# Patient Record
Sex: Female | Born: 1955 | Race: White | Hispanic: No | Marital: Married | Smoking: Former smoker
Health system: Southern US, Community
[De-identification: ages and names within clinical notes are randomized; demographics above are authoritative.]

## PROBLEM LIST (undated history)

## (undated) DIAGNOSIS — K5790 Diverticulosis of intestine, part unspecified, without perforation or abscess without bleeding: Secondary | ICD-10-CM

## (undated) DIAGNOSIS — E039 Hypothyroidism, unspecified: Secondary | ICD-10-CM

## (undated) DIAGNOSIS — K219 Gastro-esophageal reflux disease without esophagitis: Secondary | ICD-10-CM

## (undated) DIAGNOSIS — J449 Chronic obstructive pulmonary disease, unspecified: Secondary | ICD-10-CM

## (undated) DIAGNOSIS — F419 Anxiety disorder, unspecified: Secondary | ICD-10-CM

## (undated) DIAGNOSIS — R569 Unspecified convulsions: Secondary | ICD-10-CM

## (undated) DIAGNOSIS — J40 Bronchitis, not specified as acute or chronic: Secondary | ICD-10-CM

## (undated) DIAGNOSIS — M549 Dorsalgia, unspecified: Secondary | ICD-10-CM

## (undated) DIAGNOSIS — E785 Hyperlipidemia, unspecified: Secondary | ICD-10-CM

## (undated) DIAGNOSIS — M1990 Unspecified osteoarthritis, unspecified site: Secondary | ICD-10-CM

## (undated) DIAGNOSIS — G8929 Other chronic pain: Secondary | ICD-10-CM

## (undated) DIAGNOSIS — I10 Essential (primary) hypertension: Secondary | ICD-10-CM

## (undated) HISTORY — PX: COLONOSCOPY: SHX174

## (undated) HISTORY — PX: HEMORRHOID SURGERY: SHX153

## (undated) HISTORY — PX: BACK SURGERY: SHX140

## (undated) HISTORY — PX: TUBAL LIGATION: SHX77

## (undated) HISTORY — PX: CHOLECYSTECTOMY: SHX55

---

## 2001-08-03 ENCOUNTER — Ambulatory Visit (HOSPITAL_COMMUNITY): Admission: RE | Admit: 2001-08-03 | Discharge: 2001-08-03 | Payer: Self-pay | Admitting: Obstetrics and Gynecology

## 2001-08-03 ENCOUNTER — Encounter: Payer: Self-pay | Admitting: Obstetrics and Gynecology

## 2002-11-16 ENCOUNTER — Ambulatory Visit (HOSPITAL_BASED_OUTPATIENT_CLINIC_OR_DEPARTMENT_OTHER): Admission: RE | Admit: 2002-11-16 | Discharge: 2002-11-16 | Payer: Self-pay | Admitting: Orthopedic Surgery

## 2009-03-17 ENCOUNTER — Encounter: Admission: RE | Admit: 2009-03-17 | Discharge: 2009-03-17 | Payer: Self-pay | Admitting: Family Medicine

## 2010-04-04 ENCOUNTER — Encounter: Admission: RE | Admit: 2010-04-04 | Discharge: 2010-04-04 | Payer: Self-pay | Admitting: Family Medicine

## 2011-03-15 NOTE — Op Note (Signed)
   NAMEKENNY, STERN                            ACCOUNT NO.:  [id number]   MEDICAL RECORD NO.:  [id number]                   PATIENT TYPE:  AMB   LOCATION:  DSC                                  FACILITY:  [facility]   PHYSICIAN:  Katy Fitch. Naaman Plummer., M.D.          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   POSTOPERATIVE DIAGNOSIS:  Stenosing tenosynovitis, right thumb at A-1  pulley.   OPERATION PERFORMED:  Release of right thumb, A-1 pulley.   SURGEON:  Katy Fitch. Sypher, M.D.   ASSISTANT:  Jonni Sanger, P.A.   ANESTHESIA:  0.25% Marcaine and 2% lidocaine metacarpal head level block,  right thumb.   SUPERVISING ANESTHESIOLOGIST:  Bedelia Person, M.D.   INDICATIONS FOR PROCEDURE:  The patient is a 55 year old right-handed woman  with a history of type 2 diabetes and hypothyroidism.  She has chronic  triggering of her right thumb unresponsive to nonoperative measures.  Due to  failure to respond to nonsurgical means, she is brought to the operating  room at this time for release of her right thumb A-1 pulley.   DESCRIPTION OF PROCEDURE:  The patient was brought to the operating room and  placed in supine position on the operating table.  Following induction of  general anesthesia by LMA, the right arm was prepped with Betadine soap  solution and sterilely draped.  When anesthesia was satisfactory, the arm  was exsanguinated with an Esmarch bandage and an arterial tourniquet on the  proximal brachium inflated to 220 mmHg.  The procedure commenced with a  short incision directly over the palpably enlarged A-1 pulley.  The  subcutaneous tissues were carefully divided releasing the subcutaneous  fascia.  The radial proper digital nerve was identified and gently  retracted.  The isolated A-1 pulley was split with a  scalpel and scissors.  The tendon was delivered and found to have a fusiform swelling.  The wound  was then repaired with intradermal 3-0 Prolene.  A compressive dressing was  applied with an Ace wrap.   For aftercare, the patient was given a prescription for Vicodin 5 mg one or  two tablets p.o. q.4-6h. p.r.n. pain, 20 tablets without refill.                                                Katy Fitch Naaman Plummer., M.D.    RVS/MEDQ  D:  11/16/2002  T:  11/16/2002  Job:  [id number]

## 2012-02-18 LAB — PULMONARY FUNCTION TEST

## 2012-03-06 ENCOUNTER — Other Ambulatory Visit: Payer: Self-pay | Admitting: Neurosurgery

## 2012-03-06 DIAGNOSIS — M549 Dorsalgia, unspecified: Secondary | ICD-10-CM

## 2012-03-11 ENCOUNTER — Ambulatory Visit
Admission: RE | Admit: 2012-03-11 | Discharge: 2012-03-11 | Disposition: A | Discharge status: Home / Self Care | Payer: BC Managed Care – PPO | Source: Ambulatory Visit | Attending: Neurosurgery | Admitting: Neurosurgery

## 2012-03-11 VITALS — BP 131/61 | HR 80

## 2012-03-11 DIAGNOSIS — M549 Dorsalgia, unspecified: Secondary | ICD-10-CM

## 2012-03-11 MED ORDER — IOHEXOL 300 MG/ML  SOLN
10.0000 mL | Freq: Once | INTRAMUSCULAR | Status: AC | PRN
  Administered 2012-03-11: 10 mL via INTRATHECAL

## 2012-03-11 MED ORDER — DIAZEPAM 5 MG PO TABS
10.0000 mg | ORAL_TABLET | Freq: Once | ORAL | Status: AC
  Administered 2012-03-11: 10 mg via ORAL

## 2012-03-11 MED ORDER — MEPERIDINE HCL 100 MG/ML IJ SOLN
75.0000 mg | Freq: Once | INTRAMUSCULAR | Status: AC
  Administered 2012-03-11: 75 mg via INTRAMUSCULAR

## 2012-03-11 MED ORDER — ONDANSETRON HCL 4 MG/2ML IJ SOLN
4.0000 mg | Freq: Once | INTRAMUSCULAR | Status: AC
  Administered 2012-03-11: 4 mg via INTRAMUSCULAR

## 2012-03-11 NOTE — Discharge Instructions (Signed)
Myelogram Discharge Instructions  1. Go home and rest quietly for the next 24 hours.  It is important to lie flat for the next 24 hours.  Get up only to go to the restroom.  You may lie in the bed or on a couch on your back, your stomach, your left side or your right side.  You may have one pillow under your head.  You may have pillows between your knees while you are on your side or under your knees while you are on your back.  2. DO NOT drive today.  Recline the seat as far back as it will go, while still wearing your seat belt, on the way home.  3. You may get up to go to the bathroom as needed.  You may sit up for 10 minutes to eat.  You may resume your normal diet and medications unless otherwise indicated.  Drink lots of extra fluids today and tomorrow.  4. The incidence of headache, nausea, or vomiting is about 5% (one in 20 patients).  If you develop a headache, lie flat and drink plenty of fluids until the headache goes away.  Caffeinated beverages may be helpful.  If you develop severe nausea and vomiting or a headache that does not go away with flat bed rest, call [phone].  5. You may resume normal activities after your 24 hours of bed rest is over; however, do not exert yourself strongly or do any heavy lifting tomorrow. If when you get up you have a headache when standing, go back to bed and force fluids for another 24 hours.  6. Call your physician for a follow-up appointment.  The results of your myelogram will be sent directly to your physician by the following day.  7. If you have any questions or if complications develop after you arrive home, please call [phone].  Discharge instructions have been explained to the patient.  The patient, or the person responsible for the patient, fully understands these instructions.       May resume escitalopram on Mar 12, 2012, after 9:30 am.

## 2012-03-19 ENCOUNTER — Encounter (HOSPITAL_COMMUNITY): Payer: Self-pay | Admitting: Pharmacy Technician

## 2012-03-19 ENCOUNTER — Other Ambulatory Visit: Payer: Self-pay | Admitting: Neurosurgery

## 2012-03-24 ENCOUNTER — Encounter (HOSPITAL_COMMUNITY): Payer: Self-pay | Admitting: *Deleted

## 2012-03-24 NOTE — Progress Notes (Signed)
Confirmed lab appointment with pt for 03/25/12 @ 0930

## 2012-03-24 NOTE — Progress Notes (Signed)
Pt doesn't have a cardiologist  Denies ever having an echo/stress test/heart cath  Medical MD is [employer] in [place]

## 2012-03-24 NOTE — Pre-Procedure Instructions (Signed)
20 JOSCELIN FRAY  03/24/2012   Your procedure is scheduled on:  Tues, June 4 @ 11:00 AM  Report to [facility] at 8:00 AM.  Call this number if you have problems the morning of surgery: [phone]   Remember:   Do not eat food:After Midnight.  May have clear liquids: up to 4 Hours before arrival.(until 4:00 am)  Clear liquids include soda, tea, black coffee, apple or grape juice, broth.,water  Take these medicines the morning of surgery with A SIP OF WATER: Albuterol<Bring Your Inhaler With You>,Xanax,Nexium,Advair,Synthroid(Levothyroxine),and Spiriva   Do not wear jewelry, make-up or nail polish.  Do not wear lotions, powders, or perfumes.   Do not shave 48 hours prior to surgery.   Do not bring valuables to the hospital.  Contacts, dentures or bridgework may not be worn into surgery.  Leave suitcase in the car. After surgery it may be brought to your room.  For patients admitted to the hospital, checkout time is 11:00 AM the day of discharge.   Special Instructions: CHG Shower Use Special Wash: 1/2 bottle night before surgery and 1/2 bottle morning of surgery.   Please read over the following fact sheets that you were given: Pain Booklet, Coughing and Deep Breathing, Blood Transfusion Information, MRSA Information and Surgical Site Infection Prevention

## 2012-03-25 ENCOUNTER — Encounter (HOSPITAL_COMMUNITY)
Admission: RE | Admit: 2012-03-25 | Discharge: 2012-03-25 | Disposition: A | Discharge status: Home / Self Care | Payer: BC Managed Care – PPO | Source: Ambulatory Visit | Attending: Neurosurgery | Admitting: Neurosurgery

## 2012-03-25 LAB — BASIC METABOLIC PANEL
BUN: 15 mg/dL (ref 6–23)
CO2: 27 mEq/L (ref 19–32)
Chloride: 102 mEq/L (ref 96–112)
Creatinine, Ser: 0.81 mg/dL (ref 0.50–1.10)
GFR calc Af Amer: >90 mL/min (ref >90)
Potassium: 5 mEq/L (ref 3.5–5.1)

## 2012-03-25 LAB — CBC
Hemoglobin: 13.4 g/dL (ref 12.0–15.0)
RBC: 4.83 MIL/uL (ref 3.87–5.11)
WBC: 7.3 10*3/uL (ref 4.0–10.5)

## 2012-03-25 LAB — SURGICAL PCR SCREEN
MRSA, PCR: NEGATIVE
Staphylococcus aureus: NEGATIVE

## 2012-03-25 LAB — TYPE AND SCREEN
ABO/RH(D): A POS
Antibody Screen: NEGATIVE

## 2012-03-31 ENCOUNTER — Inpatient Hospital Stay (HOSPITAL_COMMUNITY)
Admission: RE | Admit: 2012-03-31 | Discharge: 2012-04-04 | DRG: 755 | Disposition: A | Discharge status: Home / Self Care | Payer: BC Managed Care – PPO | Source: Ambulatory Visit | Attending: Neurosurgery | Admitting: Neurosurgery

## 2012-03-31 ENCOUNTER — Ambulatory Visit (HOSPITAL_COMMUNITY): Payer: BC Managed Care – PPO

## 2012-03-31 ENCOUNTER — Encounter (HOSPITAL_COMMUNITY): Payer: Self-pay | Admitting: Certified Registered"

## 2012-03-31 ENCOUNTER — Encounter (HOSPITAL_COMMUNITY)
Admission: RE | Disposition: A | Discharge status: Home / Self Care | Payer: Self-pay | Source: Ambulatory Visit | Attending: Neurosurgery

## 2012-03-31 ENCOUNTER — Ambulatory Visit (HOSPITAL_COMMUNITY): Payer: BC Managed Care – PPO | Admitting: Certified Registered"

## 2012-03-31 DIAGNOSIS — Z9089 Acquired absence of other organs: Secondary | ICD-10-CM

## 2012-03-31 DIAGNOSIS — E119 Type 2 diabetes mellitus without complications: Secondary | ICD-10-CM | POA: Diagnosis present

## 2012-03-31 DIAGNOSIS — M4716 Other spondylosis with myelopathy, lumbar region: Principal | ICD-10-CM | POA: Diagnosis present

## 2012-03-31 DIAGNOSIS — F411 Generalized anxiety disorder: Secondary | ICD-10-CM | POA: Diagnosis present

## 2012-03-31 DIAGNOSIS — I10 Essential (primary) hypertension: Secondary | ICD-10-CM | POA: Diagnosis present

## 2012-03-31 DIAGNOSIS — M47816 Spondylosis without myelopathy or radiculopathy, lumbar region: Secondary | ICD-10-CM

## 2012-03-31 DIAGNOSIS — G8929 Other chronic pain: Secondary | ICD-10-CM | POA: Diagnosis present

## 2012-03-31 DIAGNOSIS — E785 Hyperlipidemia, unspecified: Secondary | ICD-10-CM | POA: Diagnosis present

## 2012-03-31 DIAGNOSIS — K219 Gastro-esophageal reflux disease without esophagitis: Secondary | ICD-10-CM | POA: Diagnosis present

## 2012-03-31 DIAGNOSIS — J449 Chronic obstructive pulmonary disease, unspecified: Secondary | ICD-10-CM | POA: Diagnosis present

## 2012-03-31 DIAGNOSIS — Z8719 Personal history of other diseases of the digestive system: Secondary | ICD-10-CM

## 2012-03-31 DIAGNOSIS — E039 Hypothyroidism, unspecified: Secondary | ICD-10-CM | POA: Diagnosis present

## 2012-03-31 DIAGNOSIS — J4489 Other specified chronic obstructive pulmonary disease: Secondary | ICD-10-CM | POA: Diagnosis present

## 2012-03-31 HISTORY — DX: Unspecified osteoarthritis, unspecified site: M19.90

## 2012-03-31 HISTORY — DX: Dorsalgia, unspecified: M54.9

## 2012-03-31 HISTORY — DX: Other chronic pain: G89.29

## 2012-03-31 HISTORY — DX: Hyperlipidemia, unspecified: E78.5

## 2012-03-31 HISTORY — DX: Chronic obstructive pulmonary disease, unspecified: J44.9

## 2012-03-31 HISTORY — DX: Essential (primary) hypertension: I10

## 2012-03-31 HISTORY — DX: Diverticulosis of intestine, part unspecified, without perforation or abscess without bleeding: K57.90

## 2012-03-31 HISTORY — DX: Bronchitis, not specified as acute or chronic: J40

## 2012-03-31 HISTORY — DX: Gastro-esophageal reflux disease without esophagitis: K21.9

## 2012-03-31 HISTORY — DX: Hypothyroidism, unspecified: E03.9

## 2012-03-31 HISTORY — DX: Anxiety disorder, unspecified: F41.9

## 2012-03-31 LAB — GLUCOSE, CAPILLARY: Glucose-Capillary: 201 mg/dL — ABNORMAL HIGH (ref 70–99)

## 2012-03-31 SURGERY — POSTERIOR LUMBAR FUSION 2 LEVEL
Anesthesia: General | Site: Back | Wound class: Clean

## 2012-03-31 MED ORDER — SODIUM CHLORIDE 0.9 % IV SOLN: 250.0000 mL | INTRAVENOUS | Status: DC

## 2012-03-31 MED ORDER — SODIUM CHLORIDE 0.9 % IJ SOLN: 3.0000 mL | INTRAMUSCULAR | Status: DC | PRN

## 2012-03-31 MED ORDER — MENTHOL 3 MG MT LOZG: 1.0000 | LOZENGE | OROMUCOSAL | Status: DC | PRN

## 2012-03-31 MED ORDER — GLIPIZIDE 10 MG PO TABS
10.0000 mg | ORAL_TABLET | Freq: Two times a day (BID) | ORAL | Status: DC
  Administered 2012-03-31 – 2012-04-04 (×8): 10 mg via ORAL
  Filled 2012-03-31 (×11): qty 1

## 2012-03-31 MED ORDER — TIOTROPIUM BROMIDE MONOHYDRATE 18 MCG IN CAPS
18.0000 ug | ORAL_CAPSULE | Freq: Every day | RESPIRATORY_TRACT | Status: DC
  Administered 2012-04-01 – 2012-04-02 (×2): 18 ug via RESPIRATORY_TRACT
  Filled 2012-03-31 (×2): qty 5

## 2012-03-31 MED ORDER — DEXAMETHASONE SODIUM PHOSPHATE 4 MG/ML IJ SOLN
INTRAMUSCULAR | Status: DC | PRN
  Administered 2012-03-31: 4 mg via INTRAVENOUS

## 2012-03-31 MED ORDER — DROPERIDOL 2.5 MG/ML IJ SOLN
INTRAMUSCULAR | Status: DC | PRN
  Administered 2012-03-31: 0.625 mg via INTRAVENOUS

## 2012-03-31 MED ORDER — CEFAZOLIN SODIUM 1-5 GM-% IV SOLN
1.0000 g | Freq: Three times a day (TID) | INTRAVENOUS | Status: AC
  Administered 2012-03-31 – 2012-04-01 (×2): 1 g via INTRAVENOUS
  Filled 2012-03-31 (×2): qty 50

## 2012-03-31 MED ORDER — SODIUM CHLORIDE 0.9 % IJ SOLN
3.0000 mL | Freq: Two times a day (BID) | INTRAMUSCULAR | Status: DC
  Administered 2012-04-02 (×2): 3 mL via INTRAVENOUS

## 2012-03-31 MED ORDER — ACETAMINOPHEN 325 MG PO TABS
650.0000 mg | ORAL_TABLET | ORAL | Status: DC | PRN
  Administered 2012-04-03 (×3): 650 mg via ORAL
  Filled 2012-03-31 (×3): qty 2

## 2012-03-31 MED ORDER — NEOSTIGMINE METHYLSULFATE 1 MG/ML IJ SOLN
INTRAMUSCULAR | Status: DC | PRN
  Administered 2012-03-31: 4 mg via INTRAVENOUS

## 2012-03-31 MED ORDER — PANTOPRAZOLE SODIUM 40 MG PO TBEC
40.0000 mg | DELAYED_RELEASE_TABLET | Freq: Every day | ORAL | Status: DC
  Administered 2012-03-31 – 2012-04-04 (×5): 40 mg via ORAL
  Filled 2012-03-31 (×4): qty 1

## 2012-03-31 MED ORDER — ONDANSETRON HCL 4 MG/2ML IJ SOLN
4.0000 mg | INTRAMUSCULAR | Status: DC | PRN
  Administered 2012-04-01: 4 mg via INTRAVENOUS
  Filled 2012-03-31: qty 2

## 2012-03-31 MED ORDER — OXYCODONE-ACETAMINOPHEN 5-325 MG PO TABS
1.0000 | ORAL_TABLET | ORAL | Status: DC | PRN
  Administered 2012-03-31 – 2012-04-04 (×8): 2 via ORAL
  Administered 2012-04-04: 1 via ORAL
  Filled 2012-03-31 (×9): qty 2

## 2012-03-31 MED ORDER — BENAZEPRIL HCL 40 MG PO TABS
40.0000 mg | ORAL_TABLET | Freq: Every day | ORAL | Status: DC
  Administered 2012-03-31 – 2012-04-02 (×2): 40 mg via ORAL
  Filled 2012-03-31 (×5): qty 1

## 2012-03-31 MED ORDER — CEFAZOLIN SODIUM 1-5 GM-% IV SOLN
INTRAVENOUS | Status: DC | PRN
  Administered 2012-03-31: 2 g via INTRAVENOUS

## 2012-03-31 MED ORDER — LEVOTHYROXINE SODIUM 100 MCG PO TABS
100.0000 ug | ORAL_TABLET | Freq: Every day | ORAL | Status: DC
  Administered 2012-04-01 – 2012-04-04 (×4): 100 ug via ORAL
  Filled 2012-03-31 (×6): qty 1

## 2012-03-31 MED ORDER — MIDAZOLAM HCL 5 MG/5ML IJ SOLN
INTRAMUSCULAR | Status: DC | PRN
  Administered 2012-03-31: 2 mg via INTRAVENOUS

## 2012-03-31 MED ORDER — LIDOCAINE HCL (CARDIAC) 20 MG/ML IV SOLN
INTRAVENOUS | Status: DC | PRN
  Administered 2012-03-31: 100 mg via INTRAVENOUS

## 2012-03-31 MED ORDER — HETASTARCH-ELECTROLYTES 6 % IV SOLN
INTRAVENOUS | Status: DC | PRN
  Administered 2012-03-31: 14:00:00 via INTRAVENOUS

## 2012-03-31 MED ORDER — 0.9 % SODIUM CHLORIDE (POUR BTL) OPTIME
TOPICAL | Status: DC | PRN
  Administered 2012-03-31: 1000 mL

## 2012-03-31 MED ORDER — PROPOFOL 10 MG/ML IV EMUL
INTRAVENOUS | Status: DC | PRN
  Administered 2012-03-31: 30 mg via INTRAVENOUS
  Administered 2012-03-31: 200 mg via INTRAVENOUS

## 2012-03-31 MED ORDER — ACETAMINOPHEN 650 MG RE SUPP: 650.0000 mg | RECTAL | Status: DC | PRN

## 2012-03-31 MED ORDER — ONDANSETRON HCL 4 MG/2ML IJ SOLN: 4.0000 mg | Freq: Four times a day (QID) | INTRAMUSCULAR | Status: DC | PRN

## 2012-03-31 MED ORDER — ROCURONIUM BROMIDE 100 MG/10ML IV SOLN
INTRAVENOUS | Status: DC | PRN
  Administered 2012-03-31: 50 mg via INTRAVENOUS

## 2012-03-31 MED ORDER — ONDANSETRON HCL 4 MG/2ML IJ SOLN
INTRAMUSCULAR | Status: DC | PRN
  Administered 2012-03-31: 4 mg via INTRAVENOUS

## 2012-03-31 MED ORDER — FLUTICASONE-SALMETEROL 250-50 MCG/DOSE IN AEPB
1.0000 | INHALATION_SPRAY | Freq: Two times a day (BID) | RESPIRATORY_TRACT | Status: DC
  Administered 2012-03-31 – 2012-04-04 (×8): 1 via RESPIRATORY_TRACT
  Filled 2012-03-31: qty 14

## 2012-03-31 MED ORDER — HYDROMORPHONE HCL PF 1 MG/ML IJ SOLN
0.2500 mg | INTRAMUSCULAR | Status: DC | PRN
  Administered 2012-03-31 (×4): 0.5 mg via INTRAVENOUS

## 2012-03-31 MED ORDER — HYDROMORPHONE HCL PF 1 MG/ML IJ SOLN
INTRAMUSCULAR | Status: AC
  Filled 2012-03-31: qty 1

## 2012-03-31 MED ORDER — LACTATED RINGERS IV SOLN
INTRAVENOUS | Status: DC | PRN
  Administered 2012-03-31 (×2): via INTRAVENOUS

## 2012-03-31 MED ORDER — ALBUTEROL SULFATE HFA 108 (90 BASE) MCG/ACT IN AERS
2.0000 | INHALATION_SPRAY | Freq: Four times a day (QID) | RESPIRATORY_TRACT | Status: DC | PRN
  Filled 2012-03-31: qty 6.7

## 2012-03-31 MED ORDER — LIDOCAINE HCL 4 % MT SOLN
OROMUCOSAL | Status: DC | PRN
  Administered 2012-03-31: 4 mL via TOPICAL

## 2012-03-31 MED ORDER — SUFENTANIL CITRATE 50 MCG/ML IV SOLN
INTRAVENOUS | Status: DC | PRN
  Administered 2012-03-31 (×2): 10 ug via INTRAVENOUS
  Administered 2012-03-31: 20 ug via INTRAVENOUS
  Administered 2012-03-31 (×2): 10 ug via INTRAVENOUS

## 2012-03-31 MED ORDER — SIMVASTATIN 5 MG PO TABS
5.0000 mg | ORAL_TABLET | Freq: Every day | ORAL | Status: DC
  Administered 2012-03-31 – 2012-04-03 (×4): 5 mg via ORAL
  Filled 2012-03-31 (×5): qty 1

## 2012-03-31 MED ORDER — MORPHINE SULFATE 2 MG/ML IJ SOLN
1.0000 mg | INTRAMUSCULAR | Status: DC | PRN
  Administered 2012-03-31 – 2012-04-01 (×4): 2 mg via INTRAVENOUS
  Administered 2012-04-01: 4 mg via INTRAVENOUS
  Administered 2012-04-01: 2 mg via INTRAVENOUS
  Filled 2012-03-31 (×3): qty 1
  Filled 2012-03-31: qty 2
  Filled 2012-03-31 (×2): qty 1

## 2012-03-31 MED ORDER — ZOLPIDEM TARTRATE 10 MG PO TABS: 10.0000 mg | ORAL_TABLET | Freq: Every evening | ORAL | Status: DC | PRN

## 2012-03-31 MED ORDER — EPHEDRINE SULFATE 50 MG/ML IJ SOLN
INTRAMUSCULAR | Status: DC | PRN
  Administered 2012-03-31 (×3): 10 mg via INTRAVENOUS

## 2012-03-31 MED ORDER — GLYCOPYRROLATE 0.2 MG/ML IJ SOLN
INTRAMUSCULAR | Status: DC | PRN
  Administered 2012-03-31: .5 mg via INTRAVENOUS

## 2012-03-31 MED ORDER — CEFAZOLIN SODIUM-DEXTROSE 2-3 GM-% IV SOLR
INTRAVENOUS | Status: AC
  Filled 2012-03-31: qty 50

## 2012-03-31 MED ORDER — METFORMIN HCL 500 MG PO TABS
1000.0000 mg | ORAL_TABLET | Freq: Two times a day (BID) | ORAL | Status: DC
  Administered 2012-03-31 – 2012-04-04 (×8): 1000 mg via ORAL
  Filled 2012-03-31 (×12): qty 2

## 2012-03-31 MED ORDER — SODIUM CHLORIDE 0.9 % IV SOLN
INTRAVENOUS | Status: DC
  Administered 2012-03-31 – 2012-04-01 (×2): via INTRAVENOUS

## 2012-03-31 MED ORDER — THROMBIN 20000 UNITS EX KIT
PACK | CUTANEOUS | Status: DC | PRN
  Administered 2012-03-31: 12:00:00 via TOPICAL

## 2012-03-31 MED ORDER — SODIUM CHLORIDE 0.9 % IV SOLN
INTRAVENOUS | Status: DC | PRN
  Administered 2012-03-31: 15:00:00 via INTRAVENOUS

## 2012-03-31 MED ORDER — PHENOL 1.4 % MT LIQD: 1.0000 | OROMUCOSAL | Status: DC | PRN

## 2012-03-31 MED ORDER — BUPIVACAINE-EPINEPHRINE PF 0.5-1:200000 % IJ SOLN
INTRAMUSCULAR | Status: DC | PRN
  Administered 2012-03-31: 30 mL

## 2012-03-31 SURGICAL SUPPLY — 75 items
APL SKNCLS STERI-STRIP NONHPOA (GAUZE/BANDAGES/DRESSINGS) ×1
BENZOIN TINCTURE PRP APPL 2/3 (GAUZE/BANDAGES/DRESSINGS) ×2 IMPLANT
BLADE SURG ROTATE 9660 (MISCELLANEOUS) IMPLANT
BUR ACORN 6.0 (BURR) ×2 IMPLANT
BUR MATCHSTICK NEURO 3.0 LAGG (BURR) ×2 IMPLANT
CANISTER SUCTION 2500CC (MISCELLANEOUS) ×2 IMPLANT
CAP REVERE LOCKING (Cap) ×6 IMPLANT
CLOTH BEACON ORANGE TIMEOUT ST (SAFETY) ×2 IMPLANT
CONN CROSSLINK REV 38-50MM (Connector) ×2 IMPLANT
CONNECTOR CRSLINK REV 38-50MM (Connector) IMPLANT
CONT SPEC 4OZ CLIKSEAL STRL BL (MISCELLANEOUS) ×3 IMPLANT
COVER BACK TABLE 24X17X13 BIG (DRAPES) IMPLANT
COVER TABLE BACK 60X90 (DRAPES) ×2 IMPLANT
DRAPE C-ARM 42X72 X-RAY (DRAPES) ×4 IMPLANT
DRAPE LAPAROTOMY 100X72X124 (DRAPES) ×2 IMPLANT
DRAPE POUCH INSTRU U-SHP 10X18 (DRAPES) ×2 IMPLANT
DRAPE PROXIMA HALF (DRAPES) ×2 IMPLANT
DRSG PAD ABDOMINAL 8X10 ST (GAUZE/BANDAGES/DRESSINGS) IMPLANT
DURAPREP 26ML APPLICATOR (WOUND CARE) ×2 IMPLANT
ELECT BLADE 4.0 EZ CLEAN MEGAD (MISCELLANEOUS) ×2
ELECT REM PT RETURN 9FT ADLT (ELECTROSURGICAL) ×2
ELECTRODE BLDE 4.0 EZ CLN MEGD (MISCELLANEOUS) IMPLANT
ELECTRODE REM PT RTRN 9FT ADLT (ELECTROSURGICAL) ×1 IMPLANT
EVACUATOR 1/8 PVC DRAIN (DRAIN) IMPLANT
GAUZE SPONGE 4X4 16PLY XRAY LF (GAUZE/BANDAGES/DRESSINGS) ×2 IMPLANT
GLOVE BIOGEL M 8.0 STRL (GLOVE) ×3 IMPLANT
GLOVE BIOGEL PI IND STRL 7.5 (GLOVE) IMPLANT
GLOVE BIOGEL PI IND STRL 8 (GLOVE) IMPLANT
GLOVE BIOGEL PI IND STRL 8.5 (GLOVE) IMPLANT
GLOVE BIOGEL PI INDICATOR 7.5 (GLOVE) ×1
GLOVE BIOGEL PI INDICATOR 8 (GLOVE) ×1
GLOVE BIOGEL PI INDICATOR 8.5 (GLOVE) ×1
GLOVE ECLIPSE 7.5 STRL STRAW (GLOVE) ×3 IMPLANT
GLOVE ECLIPSE 8.5 STRL (GLOVE) ×1 IMPLANT
GLOVE EXAM NITRILE LRG STRL (GLOVE) IMPLANT
GLOVE EXAM NITRILE MD LF STRL (GLOVE) ×1 IMPLANT
GLOVE EXAM NITRILE XL STR (GLOVE) IMPLANT
GLOVE EXAM NITRILE XS STR PU (GLOVE) IMPLANT
GOWN BRE IMP SLV AUR LG STRL (GOWN DISPOSABLE) ×3 IMPLANT
GOWN BRE IMP SLV AUR XL STRL (GOWN DISPOSABLE) ×1 IMPLANT
GOWN STRL REIN 2XL LVL4 (GOWN DISPOSABLE) ×3 IMPLANT
KIT BASIN OR (CUSTOM PROCEDURE TRAY) ×2 IMPLANT
KIT ROOM TURNOVER OR (KITS) ×2 IMPLANT
MILL MEDIUM DISP (BLADE) ×1 IMPLANT
NDL HYPO 18GX1.5 BLUNT FILL (NEEDLE) IMPLANT
NDL HYPO 21X1.5 SAFETY (NEEDLE) IMPLANT
NDL HYPO 25X1 1.5 SAFETY (NEEDLE) IMPLANT
NEEDLE HYPO 18GX1.5 BLUNT FILL (NEEDLE) IMPLANT
NEEDLE HYPO 21X1.5 SAFETY (NEEDLE) ×2 IMPLANT
NEEDLE HYPO 25X1 1.5 SAFETY (NEEDLE) IMPLANT
NS IRRIG 1000ML POUR BTL (IV SOLUTION) ×2 IMPLANT
PACK LAMINECTOMY NEURO (CUSTOM PROCEDURE TRAY) ×2 IMPLANT
PAD ARMBOARD 7.5X6 YLW CONV (MISCELLANEOUS) ×6 IMPLANT
PATTIES SURGICAL .5 X1 (DISPOSABLE) ×1 IMPLANT
PATTIES SURGICAL .5 X3 (DISPOSABLE) IMPLANT
ROD REVERE 6.35X85MM (Rod) ×2 IMPLANT
SCREW REVERE 6.35 5.5X40MM (Screw) ×6 IMPLANT
SPACER CALIBER 10X22MM 10-15MM (Spacer) ×1 IMPLANT
SPONGE GAUZE 4X4 12PLY (GAUZE/BANDAGES/DRESSINGS) ×2 IMPLANT
SPONGE LAP 4X18 X RAY DECT (DISPOSABLE) IMPLANT
SPONGE NEURO XRAY DETECT 1X3 (DISPOSABLE) IMPLANT
SPONGE SURGIFOAM ABS GEL 100 (HEMOSTASIS) ×2 IMPLANT
STRIP CLOSURE SKIN 1/2X4 (GAUZE/BANDAGES/DRESSINGS) ×2 IMPLANT
SUT VIC AB 1 CT1 18XBRD ANBCTR (SUTURE) ×2 IMPLANT
SUT VIC AB 1 CT1 8-18 (SUTURE) ×4
SUT VIC AB 2-0 CP2 18 (SUTURE) ×3 IMPLANT
SUT VIC AB 3-0 SH 8-18 (SUTURE) ×2 IMPLANT
SYR 20CC LL (SYRINGE) IMPLANT
SYR 20ML ECCENTRIC (SYRINGE) ×2 IMPLANT
SYR 5ML LL (SYRINGE) IMPLANT
TAPE CLOTH SURG 4X10 WHT LF (GAUZE/BANDAGES/DRESSINGS) ×1 IMPLANT
TOWEL OR 17X24 6PK STRL BLUE (TOWEL DISPOSABLE) ×2 IMPLANT
TOWEL OR 17X26 10 PK STRL BLUE (TOWEL DISPOSABLE) ×2 IMPLANT
TRAY FOLEY CATH 14FRSI W/METER (CATHETERS) ×2 IMPLANT
WATER STERILE IRR 1000ML POUR (IV SOLUTION) ×2 IMPLANT

## 2012-03-31 NOTE — Progress Notes (Signed)
l12 and l23 decompression and fusion done.Marland Kitchenoperative report [phone]

## 2012-03-31 NOTE — Progress Notes (Signed)
Call placed to neuro OR and message with Dr Jeral Fruit informing that orders have not been signed.  Dr Jeral Fruit in the OR now.  Will get permit signed when pt gets upstairs.

## 2012-03-31 NOTE — Anesthesia Postprocedure Evaluation (Signed)
Anesthesia Post Note  Patient: Donna [name]  Procedure(s) Performed: Procedure(s) (LRB): POSTERIOR LUMBAR FUSION 2 LEVEL (N/A)  Anesthesia type: General  Patient location: PACU  Post pain: Pain level controlled and Adequate analgesia  Post assessment: Post-op Vital signs reviewed, Patient's Cardiovascular Status Stable, Respiratory Function Stable, Patent Airway and Pain level controlled  Last Vitals:  Filed Vitals:   03/31/12 1524  BP:   Pulse: 111  Temp:   Resp: 18    Post vital signs: Reviewed and stable  Level of consciousness: awake, alert  and oriented  Complications: No apparent anesthesia complications

## 2012-03-31 NOTE — Transfer of Care (Signed)
Immediate Anesthesia Transfer of Care Note  Patient: Donna [name]  Procedure(s) Performed: Procedure(s) (LRB): POSTERIOR LUMBAR FUSION 2 LEVEL (N/A)  Patient Location: PACU  Anesthesia Type: General  Level of Consciousness: awake, alert  and oriented  Airway & Oxygen Therapy: Patient Spontanous Breathing and Patient connected to face mask oxygen  Post-op Assessment: Report given to PACU RN and Post -op Vital signs reviewed and stable  Post vital signs: Reviewed and stable  Complications: No apparent anesthesia complications

## 2012-03-31 NOTE — Anesthesia Preprocedure Evaluation (Signed)
Anesthesia Evaluation  Patient identified by MRN, date of birth, ID band Patient awake    Reviewed: Allergy & Precautions, H&P , NPO status , Patient's Chart, lab work & pertinent test results  Airway Mallampati: II  Neck ROM: full    Dental   Pulmonary COPD         Cardiovascular hypertension,     Neuro/Psych PSYCHIATRIC DISORDERS Anxiety    GI/Hepatic GERD-  ,  Endo/Other  Diabetes mellitus-, Type 2Hypothyroidism Morbid obesity  Renal/GU      Musculoskeletal   Abdominal   Peds  Hematology   Anesthesia Other Findings   Reproductive/Obstetrics                           Anesthesia Physical Anesthesia Plan  ASA: III  Anesthesia Plan: General   Post-op Pain Management:    Induction: Intravenous  Airway Management Planned: Oral ETT  Additional Equipment:   Intra-op Plan:   Post-operative Plan: Extubation in OR  Informed Consent: I have reviewed the patients History and Physical, chart, labs and discussed the procedure including the risks, benefits and alternatives for the proposed anesthesia with the patient or authorized representative who has indicated his/her understanding and acceptance.     Plan Discussed with: CRNA and Surgeon  Anesthesia Plan Comments:         Anesthesia Quick Evaluation

## 2012-03-31 NOTE — H&P (Signed)
Donna [name] is an 56 y.o. female.   Chief Complaint: lumbar pain HPI: patient came to see me with a history of lumbar pain foe almost 5 years after a fall. Also she complains of numbness in the left thigh. radiokogiocal study were made and because of the findings , she is being admitted for surgery.   Past Medical History  Diagnosis Date  . Hypertension     takes Benazepril daily  . Hyperlipidemia     takes Pravastatin daily  . COPD (chronic obstructive pulmonary disease)     recently diagnosed   . Bronchitis     hx of-many yrs ago  . Arthritis     back   . Chronic back pain   . Diverticulosis   . GERD (gastroesophageal reflux disease)     takes Nexium daily  . Hypothyroidism     takes SYnthroid daily  . Anxiety     takes Xanax prn  . Diabetes mellitus     takes Metformin and GLipizide daily    Past Surgical History  Procedure Date  . Cesarean section     x 2  . Hemorrhoid surgery   . Cholecystectomy   . Colonoscopy     Family History  Problem Relation Age of Onset  . Anesthesia problems Neg Hx   . Hypotension Neg Hx   . Malignant hyperthermia Neg Hx   . Pseudochol deficiency Neg Hx    Social History:  does not have a smoking history on file. She does not have any smokeless tobacco history on file. She reports that she does not drink alcohol or use illicit drugs.  Allergies:  Allergies  Allergen Reactions  . Other Other (See Comments)    Steri strip breaks out    Medications Prior to Admission  Medication Sig Dispense Refill  . albuterol (PROVENTIL HFA;VENTOLIN HFA) 108 (90 BASE) MCG/ACT inhaler Inhale 2 puffs into the lungs every 6 (six) hours as needed. Shortness of breath      . ALPRAZolam (XANAX) 0.5 MG tablet Take 0.5 mg by mouth at bedtime as needed. Sleep/anxiety.      . benazepril (LOTENSIN) 40 MG tablet Take 40 mg by mouth daily.      . Fluticasone-Salmeterol (ADVAIR) 250-50 MCG/DOSE AEPB Inhale 1 puff into the lungs every 12 (twelve) hours.        Marland Kitchen glipiZIDE (GLUCOTROL) 10 MG tablet Take 10 mg by mouth 2 (two) times daily before a meal.      . levothyroxine (SYNTHROID, LEVOTHROID) 100 MCG tablet Take 100 mcg by mouth daily.      . metFORMIN (GLUCOPHAGE) 1000 MG tablet Take 1,000 mg by mouth 2 (two) times daily with a meal.      . pravastatin (PRAVACHOL) 20 MG tablet Take 20 mg by mouth daily.      Marland Kitchen tiotropium (SPIRIVA) 18 MCG inhalation capsule Place 18 mcg into inhaler and inhale daily.      Marland Kitchen esomeprazole (NEXIUM) 20 MG capsule Take 20 mg by mouth daily before breakfast.        Results for orders placed during the hospital encounter of 03/31/12 (from the past 48 hour(s))  GLUCOSE, CAPILLARY     Status: Abnormal   Collection Time   03/31/12  8:06 AM      Component Value Range Comment   Glucose-Capillary 187 (*) 70 - 99 (mg/dL)    No results found.  Review of Systems  Constitutional: Negative.   HENT: Negative.   Eyes: Negative.  Respiratory:       Copd  Cardiovascular: Negative.   Gastrointestinal: Negative.   Genitourinary: Negative.   Musculoskeletal: Positive for back pain.  Skin: Negative.   Neurological: Positive for focal weakness.  Endo/Heme/Allergies:       Thyroid disease  Psychiatric/Behavioral: Negative.     Blood pressure 124/82, pulse 87, temperature 98.4 F (36.9 C), temperature source Oral, resp. rate 18, SpO2 94.00%. Physical Exam hent. Nl. Neck, nl. Cv, nl. Lungs, bilatreal rales. Abdomen. Can not feel a mass secondary to her obesity. Extremities,nl. NEURO weakness of the iliopsoas, abductor and adductor. Sensation, numbness of left l1-2-3 dermatomes. Femoral strech maneuver positive bilaterally. [facility] and 23 with stepp-off at 1.  Assessment/Plan Decompression and fusion at l12 and 23. fpatient aware of risks and benefits of the surgery  Paquita Printy M 03/31/2012, 10:26 AM

## 2012-03-31 NOTE — Op Note (Signed)
Donna [name], Donna [name] NO.:  [id number]  MEDICAL RECORD NO.:  [id number]  LOCATION:  3012                         FACILITY:  [facility]  PHYSICIAN:  Hilda Lias, M.D.   DATE OF BIRTH:  06/21/1956  DATE OF PROCEDURE:  03/31/2012 DATE OF DISCHARGE:                              OPERATIVE REPORT   PREOPERATIVE DIAGNOSIS:  Degenerative disk disease lumbar 1-2, lumbar 2- 3 with chronic radiculopathy and weakness of both proximal legs.  POSTOPERATIVE DIAGNOSIS:  Degenerative disk disease lumbar 1-2, lumbar 2- 3 with chronic radiculopathy and weakness of both proximal legs.  PROCEDURE:  Bilateral L1-L2 laminectomy, facetectomy, interbody fusion with a single cage at the level L2-L3, pedicles at the level of L1, L2, L3 with posterolateral arthrodesis with autograft.  Cell Saver, C-arm.  SURGEON:  Hilda Lias, M.D.  ASSISTANT:  Stefani Dama, M.D.  CLINICAL HISTORY:  The patient was seen in my office, complaining of back pain worsened to both legs.  The patient is 5 feet 2 inches, 237 pounds.  X-ray showed severe degenerative disk disease at the level of L1-L2, L2-L3.  The patient had proximal weakness of both legs.  Surgery was advised and she and her friend knew of the risk of the surgery.  PROCEDURE:  The patient was taken to the OR and after intubation, she was positioned in a prone manner.  The back was cleaned with DuraPrep and drapes were applied.  Midline incision was started in the upper lumbar area through the skin, subcutaneous tissue down to the spinous process.  With at least 3 x-rays, we were able to visualize and count the spacer from the L5-S1.  Because of the adipose tissue, it was difficult to see but at the end we were able to visualize the spinal process of thoracic 12, L1 and L2.  From then on, we proceeded with removal of spinous process of L1-L2 and lamina bilateral of those 2 levels.  Decompression of the thecal sac was accomplished.  We  removed the facet of L1 and L2 at both sides.  Then we tried to enter the disk space of L1 and 2, but the area was quite narrow.  It was difficult to get into.  Then we went down to the level of L2-L3.  We entered the disk in the right side.  Total gross diskectomy from the right side going to the midline was achieved.  Then, we were able to introduce a cage of 10 mm height x 22 of length with autograft inside.  The cage was expanded all the way down to 15.  From then on, we went back and visualize and be able to see that the L1, L2, L3 nerves were completely decompressed. Having done this, using the C-arm, first in frontal view and then a lateral view, we probed the pedicle of L1, L2, L3.  Prior to insertion of the pedicles, we were sure that the hole was surrounded by bone. Then, a #6 screws, 5.5 x 40 were inserted.  They were kept together in place using a rod with caps on top.  From then on, after the area was tight in place, a cross-link from right  to left rod was used.  Then we went laterally and we removed the periosteum of L1-L2, L2-L3, lateral facet and transverse process.  Autograft was used for arthrodesis.  Having done this, the area was irrigated.  We left a large Hemovac and the wound was closed in 3 different layers with Vicryl and Steri-Strips.          ______________________________ Hilda Lias, M.D.     EB/MEDQ  D:  03/31/2012  T:  03/31/2012  Job:  [id number]

## 2012-04-01 LAB — GLUCOSE, CAPILLARY
Glucose-Capillary: 138 mg/dL — ABNORMAL HIGH (ref 70–99)
Glucose-Capillary: 103 mg/dL — ABNORMAL HIGH (ref 70–99)

## 2012-04-01 MED ORDER — OXYCODONE HCL 5 MG PO TABS
5.0000 mg | ORAL_TABLET | ORAL | Status: DC | PRN
  Administered 2012-04-01: 15 mg via ORAL
  Administered 2012-04-01 (×2): 5 mg via ORAL
  Administered 2012-04-02 (×5): 15 mg via ORAL
  Administered 2012-04-04: 10 mg via ORAL
  Filled 2012-04-01 (×3): qty 3
  Filled 2012-04-01: qty 1
  Filled 2012-04-01 (×2): qty 3
  Filled 2012-04-01: qty 2
  Filled 2012-04-01 (×2): qty 1
  Filled 2012-04-01: qty 3

## 2012-04-01 MED ORDER — CYCLOBENZAPRINE HCL 10 MG PO TABS
10.0000 mg | ORAL_TABLET | Freq: Three times a day (TID) | ORAL | Status: DC | PRN
  Administered 2012-04-01 – 2012-04-03 (×4): 10 mg via ORAL
  Filled 2012-04-01 (×6): qty 1

## 2012-04-01 NOTE — Progress Notes (Signed)
Occupational Therapy Treatment Patient Details Name: Donna [name] MRN: [id number] DOB: [birth year] Today's Date: 04/01/2012 Time: 1440-1450 OT Time Calculation (min): 10 min  OT Assessment / Plan / Recommendation Comments on Treatment Session Pt. with request on further education on completing Peri care with following back precautions and pt. educated on use of toilet hygiene aid to complete.    Follow Up Recommendations  Supervision/Assistance - 24 hour       Equipment Recommendations  Rolling walker with 5" wheels;3 in 1 bedside comode       Frequency Min 2X/week   Plan Discharge plan remains appropriate    Precautions / Restrictions Precautions Precautions: Back Precaution Booklet Issued: Yes (comment) Precaution Comments: Pt educated on 3/3 back precautions. Restrictions Weight Bearing Restrictions: No       ADL  Toilet Transfer: Simulated;Minimal assistance Toilet Transfer Method: Stand pivot Toilet Transfer Equipment: Other (comment) (EOB) Toileting - Clothing Manipulation and Hygiene: Simulated;Maximal assistance Where Assessed - Toileting Clothing Manipulation and Hygiene: Sit on 3-in-1 or toilet Transfers/Ambulation Related to ADLs: Min assist with RW and verbal cueing to avoid twisting. ADL Comments: Pt. educated on use of toilet hygiene aid to complete peri care to prevent bending/twisting.       OT Goals Acute Rehab OT Goals OT Goal Formulation: With patient Time For Goal Achievement: 04/08/12 Potential to Achieve Goals: Good ADL Goals Additional ADL Goal #1: Pt will perform all components of toileting ADL with supervision while maintaining back safety precautions. ADL Goal: Additional Goal #1 - Progress: Progressing toward goals Miscellaneous OT Goals Miscellaneous OT Goal #1: Pt will perform bed mobility with supervision in prep for EOB ADLs. OT Goal: Miscellaneous Goal #1 - Progress: Progressing toward goals Miscellaneous OT Goal #2: Pt will  independently verbalize and demonstrate 3/3 back precautions during all ADL activity. OT Goal: Miscellaneous Goal #2 - Progress: Progressing toward goals  Visit Information  Last OT Received On: 04/01/12 Assistance Needed: +1 PT/OT Co-Evaluation/Treatment: Yes          Cognition  Overall Cognitive Status: Appears within functional limits for tasks assessed/performed Arousal/Alertness: Awake/alert Orientation Level: Appears intact for tasks assessed Behavior During Session: [facility] for tasks performed    Mobility Bed Mobility Bed Mobility: Sit to Sidelying Right Right Sidelying to Sit: 2: Max assist;With rails Details for Bed Mobility Assistance: Mod verbal cues for log rolling technique and proper hand placement Transfers Transfers: Sit to Stand Sit to Stand: 4: Min assist;With upper extremity assist;From bed Stand to Sit: 4: Min assist;To chair/3-in-1;With upper extremity assist;With armrests Details for Transfer Assistance: VCs for hand placement, assist to achieve upright position with sit to stand         End of Session OT - End of Session Equipment Utilized During Treatment: Gait belt Activity Tolerance: Patient limited by pain Patient left: in bed;with call bell/phone within reach;with family/visitor present Nurse Communication: Mobility status   Donna [name], OTR/L Pager [phone] 04/01/2012, 3:01 PM

## 2012-04-01 NOTE — Progress Notes (Signed)
Patient ID: Donna [name], female   DOB: 12-Nov-1955, 56 y.o.   MRN: [id number] Stable, c/o incisional pain. No leg pain, numbness better. Foley out

## 2012-04-01 NOTE — Evaluation (Signed)
Physical Therapy Evaluation Patient Details Name: Donna [name] MRN: [id number] DOB: 1956-07-23 Today's Date: 04/01/2012 Time: 4098-1191 PT Time Calculation (min): 23 min  PT Assessment / Plan / Recommendation Clinical Impression  56 y.o. female who is s/p lumbar laminectomey L1-2, L2-3. Pt was independent with mobility PTA. During PT eval pt had significant pain and nausea which limited activity tolerance. Good progress expected.  RW recommended. likely no HHPT needed.     PT Assessment  Patient needs continued PT services    Follow Up Recommendations  No PT follow up    Barriers to Discharge None      lEquipment Recommendations  3 in 1 bedside comode    Recommendations for Other Services OT consult   Frequency 7X/week    Precautions / Restrictions Precautions Precautions: Back Precaution Booklet Issued: Yes (comment) Precaution Comments: Pt educated on 3/3 back precautions. Restrictions Weight Bearing Restrictions: No   Pertinent Vitals/Pain *7/10 back pain, pt also having nausea Pt premedicated, RN notified**      Mobility  Bed Mobility Bed Mobility: Left Sidelying to Sit (Simultaneous filing. User may not have seen previous data.) Right Sidelying to Sit: 1: +2 Total assist;With rails;HOB flat Right Sidelying to Sit: Patient Percentage: 50% Left Sidelying to Sit: 1: +2 Total assist;HOB flat;With rails Left Sidelying to Sit: Patient Percentage: 50% Sitting - Scoot to Edge of Bed: 4: Min assist;Other (comment) (increased time) Details for Bed Mobility Assistance: assist to elevate trunk, heavy use of BUEs Transfers Transfers: Sit to Stand;Stand to Sit Sit to Stand: 3: Mod assist;From bed;With upper extremity assist Stand to Sit: 4: Min assist;To chair/3-in-1;With upper extremity assist;With armrests Details for Transfer Assistance: VCs for hand placement, assist to achieve upright position with sit to stand Ambulation/Gait Ambulation/Gait Assistance: 4: Min  assist Ambulation Distance (Feet): 5 Feet Assistive device: Rolling walker Ambulation/Gait Assistance Details: distance limited by pain/nausea Gait Pattern: Decreased step length - left;Decreased step length - right;Step-to pattern Stairs: No Wheelchair Mobility Wheelchair Mobility: No    Exercises     PT Diagnosis: Difficulty walking;Acute pain  PT Problem List: Decreased mobility;Pain;Decreased activity tolerance;Decreased knowledge of precautions PT Treatment Interventions: Gait training;Stair training;DME instruction;Functional mobility training;Patient/family education   PT Goals Acute Rehab PT Goals PT Goal Formulation: With patient Time For Goal Achievement: 04/06/12 Potential to Achieve Goals: Good Pt will go Supine/Side to Sit: with modified independence PT Goal: Supine/Side to Sit - Progress: Goal set today Pt will go Sit to Stand: with modified independence PT Goal: Sit to Stand - Progress: Goal set today Pt will Ambulate: 51 - 150 feet;with rolling walker PT Goal: Ambulate - Progress: Goal set today Pt will Go Up / Down Stairs: 1-2 stairs;with min assist PT Goal: Up/Down Stairs - Progress: Goal set today  Visit Information  Last PT Received On: 04/01/12 Assistance Needed: +1 PT/OT Co-Evaluation/Treatment: Yes    Subjective Data  Subjective: I wish my husband was here so he could pick me up out of bed.  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, stopped work due to pain  pain Communication Communication: No difficulties Dominant Hand:  Right    Cognition  Overall Cognitive Status: Appears within functional limits for tasks assessed/performed Arousal/Alertness: Awake/alert Orientation Level: Appears intact for tasks assessed Behavior During Session: [facility] for tasks performed    Extremity/Trunk Assessment Right Upper Extremity Assessment RUE ROM/Strength/Tone: [facility] for tasks assessed Left Upper Extremity Assessment LUE ROM/Strength/Tone: WFL for tasks assessed Right Lower Extremity Assessment RLE ROM/Strength/Tone: Within functional levels RLE Sensation: WFL - Light Touch RLE Coordination: WFL - gross/fine motor Left Lower Extremity Assessment LLE ROM/Strength/Tone: Within functional levels LLE Sensation: WFL - Light Touch LLE Coordination: WFL - gross/fine motor Trunk Assessment Trunk Assessment: Normal   Balance    End of Session PT - End of Session Activity Tolerance: Patient limited by pain Patient left: in chair;with call bell/phone within reach Nurse Communication: Mobility status   Tamala Ser 04/01/2012, 9:23 AM [phone]

## 2012-04-01 NOTE — Clinical Social Work Note (Signed)
CSW received consult for "SNF." Discussed pt with RN in progression rounds. PT/OT are recommending DME. RNCM is aware and following. CSW is signing off as no further needs identified. Please reconsult if a need arises prior to discharge.   Dede Query, MSW, Theresia Majors [phone]

## 2012-04-01 NOTE — Progress Notes (Signed)
Occupational Therapy Evaluation Patient Details Name: Donna [name] MRN: [id number] DOB: 04-Aug-1956 Today's Date: 04/01/2012 Time: 4098-1191 OT Time Calculation (min): 22 min  OT Assessment / Plan / Recommendation Clinical Impression  Pt s/p PLIF thus affecting PLOF. Will benefit from acute OT to address below problem list in prep for d/c home with family.    OT Assessment  Patient needs continued OT Services    Follow Up Recommendations  Supervision/Assistance - 24 hour    Barriers to Discharge      Equipment Recommendations  Rolling walker with 5" wheels;3 in 1 bedside comode    Recommendations for Other Services    Frequency  Min 2X/week    Precautions / Restrictions Precautions Precautions: Back Precaution Booklet Issued: Yes (comment) Precaution Comments: Pt educated on 3/3 back precautions. Restrictions Weight Bearing Restrictions: No   Pertinent Vitals/Pain See vitals    ADL  Grooming: Performed;Wash/dry face;Supervision/safety Where Assessed - Grooming: Unsupported sitting Lower Body Bathing: Simulated;+1 Total assistance Where Assessed - Lower Body Bathing: Unsupported sitting Upper Body Dressing: Performed;Set up Where Assessed - Upper Body Dressing: Unsupported sitting Lower Body Dressing: Performed;+1 Total assistance Where Assessed - Lower Body Dressing: Unsupported sitting Toilet Transfer: Simulated;Moderate assistance Toilet Transfer Method:  (ambulating) Acupuncturist:  (chair) Equipment Used: Rolling walker Transfers/Ambulation Related to ADLs: Min assist with RW and verbal cueing to avoid twisting. ADL Comments: Limited participation in ADLs due to pain and nausea.    OT Diagnosis: Acute pain  OT Problem List: Decreased activity tolerance;Decreased knowledge of use of DME or AE;Decreased knowledge of precautions;Pain OT Treatment Interventions: Self-care/ADL training;DME and/or AE instruction;Therapeutic activities;Patient/family  education   OT Goals Acute Rehab OT Goals OT Goal Formulation: With patient Time For Goal Achievement: 04/08/12 Potential to Achieve Goals: Good ADL Goals Pt Will Perform Grooming: with set-up;Standing at sink ADL Goal: Grooming - Progress: Goal set today Pt Will Perform Lower Body Bathing: with set-up;Sit to stand from chair;Sit to stand from bed;with adaptive equipment ADL Goal: Lower Body Bathing - Progress: Goal set today Pt Will Perform Lower Body Dressing: with set-up;Sit to stand from chair;Sit to stand from bed;with adaptive equipment ADL Goal: Lower Body Dressing - Progress: Goal set today Pt Will Perform Tub/Shower Transfer: Tub transfer;with supervision;Ambulation;with DME;Maintaining back safety precautions ADL Goal: Tub/Shower Transfer - Progress: Goal set today Additional ADL Goal #1: Pt will perform all components of toileting ADL with supervision while maintaining back safety precautions. ADL Goal: Additional Goal #1 - Progress: Goal set today Miscellaneous OT Goals Miscellaneous OT Goal #1: Pt will perform bed mobility with supervision in prep for EOB ADLs. OT Goal: Miscellaneous Goal #1 - Progress: Goal set today Miscellaneous OT Goal #2: Pt will independently verbalize and demonstrate 3/3 back precautions during all ADL activity. OT Goal: Miscellaneous Goal #2 - Progress: Goal set today  Visit Information  Last OT Received On: 04/01/12 Assistance Needed: +1 PT/OT Co-Evaluation/Treatment: Yes    Subjective Data      Prior Functioning  Home Living Lives With: Spouse;Daughter Available Help at Discharge: Family Type of Home: House Home Access: Stairs to enter Secretary/administrator of Steps: 2 Entrance Stairs-Rails: None Home Layout: One level Bathroom Shower/Tub: Engineer, manufacturing systems: Standard Home Adaptive Equipment: Crutches;Straight cane Prior Function Level of Independence: Independent Able to Take Stairs?: Yes Driving: Yes 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, stopped work due to pain pain Communication Communication: No difficulties Dominant Hand: Right    Cognition  Overall Cognitive Status:  Appears within functional limits for tasks assessed/performed Arousal/Alertness: Awake/alert Orientation Level: Appears intact for tasks assessed Behavior During Session: [facility] for tasks performed    Extremity/Trunk Assessment Right Upper Extremity Assessment RUE ROM/Strength/Tone: [facility] for tasks assessed Left Upper Extremity Assessment LUE ROM/Strength/Tone: WFL for tasks assessed Right Lower Extremity Assessment RLE ROM/Strength/Tone: Within functional levels RLE Sensation: WFL - Light Touch RLE Coordination: WFL - gross/fine motor Left Lower Extremity Assessment LLE ROM/Strength/Tone: Within functional levels LLE Sensation: WFL - Light Touch LLE Coordination: WFL - gross/fine motor Trunk Assessment Trunk Assessment: Normal   Mobility Bed Mobility Bed Mobility: Left Sidelying to Sit (Simultaneous filing. User may not have seen previous data.) Right Sidelying to Sit: 1: +2 Total assist;With rails;HOB flat Right Sidelying to Sit: Patient Percentage: 50% Left Sidelying to Sit: 1: +2 Total assist;HOB flat;With rails Left Sidelying to Sit: Patient Percentage: 50% Sitting - Scoot to Edge of Bed: 4: Min assist;Other (comment) (increased time) Details for Bed Mobility Assistance: assist to elevate trunk, heavy use of BUEs Transfers Sit to Stand: 3: Mod assist;From bed;With upper extremity assist Stand to Sit: 4: Min assist;To chair/3-in-1;With upper extremity assist;With armrests Details for Transfer Assistance: VCs for hand placement, assist to achieve upright position with sit to stand   Exercise    Balance    End of Session OT - End of Session Activity Tolerance: Patient limited by pain (limited by nausea) Patient left: in chair;with call bell/phone within reach Nurse Communication: Mobility status  (nausea)  04/01/2012 Cipriano Mile OTR/L Pager [phone] Office [phone]  Cipriano Mile 04/01/2012, 9:38 AM

## 2012-04-01 NOTE — Care Management Note (Signed)
    Page 1 of 1   04/06/2012     8:40:36 AM   CARE MANAGEMENT NOTE 04/06/2012  Patient:  Donna [name], Donna [name]   Account Number:  [id number]  Date Initiated:  04/01/2012  Documentation initiated by:  Jacquelynn Cree  Subjective/Objective Assessment:   Admitted postop L1-2 laminectomy with fusion. Lives with spouse and adult daughter.     Action/Plan:   PT eval- recommending 3 in 1 and rolling walker  OT eval- no d/c needs   Anticipated DC Date:  04/04/2012   Anticipated DC Plan:  HOME/SELF CARE      DC Planning Services  CM consult      Choice offered to / List presented to:             Status of service:  Completed, signed off Medicare Important Message given?   (If response is "NO", the following Medicare IM given date fields will be blank) Date Medicare IM given:   Date Additional Medicare IM given:    Discharge Disposition:  HOME/SELF CARE  Per UR Regulation:  Reviewed for med. necessity/level of care/duration of stay  If discussed at Long Length of Stay Meetings, dates discussed:    Comments:  04/06/12 Spoke with patient on 04/02/12, she has a walker and 3 in 1 at home. No d/c needs identified. Jacquelynn Cree RN, [facility]   04/01/12 Spoke with patient about DME recommendations from PT/OT.   Patient thinks she has a rolling walker and is not sure she needs 3 in 1. She will let me know after speaking with family.Will check back on 04/02/12. Will continue to follow for d/c needs. Jacquelynn Cree RN, BSN, CCM

## 2012-04-01 NOTE — Progress Notes (Signed)
Inpatient Diabetes Program Recommendations  AACE/ADA: New Consensus Statement on Inpatient Glycemic Control (2009)  Target Ranges:  Prepandial:   less than 140 mg/dL      Peak postprandial:   less than 180 mg/dL (1-2 hours)      Critically ill patients:  140 - 180 mg/dL   Reason for Visit: Hyperglycemia  Results for Donna [name], Donna [name] (MRN [id number]) as of 04/01/2012 16:18  Ref. Range 03/31/2012 08:06 03/31/2012 15:23 04/01/2012 08:05  Glucose-Capillary Latest Range: 70-99 mg/dL 132 (H) 440 (H) 102 (H)    Inpatient Diabetes Program Recommendations Correction (SSI): Novolog sensitive tidwc HgbA1C: Check HgbA1C to assess glycemic control prior to hospitalization Diet: Change diet to CHO-mod medium  Note: Will follow.

## 2012-04-02 LAB — GLUCOSE, CAPILLARY
Glucose-Capillary: 161 mg/dL — ABNORMAL HIGH (ref 70–99)
Glucose-Capillary: 110 mg/dL — ABNORMAL HIGH (ref 70–99)

## 2012-04-02 MED ORDER — FLEET ENEMA 7-19 GM/118ML RE ENEM
1.0000 | ENEMA | Freq: Every day | RECTAL | Status: DC | PRN
  Administered 2012-04-02: 1 via RECTAL
  Filled 2012-04-02: qty 1

## 2012-04-02 NOTE — Progress Notes (Addendum)
Physical Therapy Treatment Patient Details Name: Donna [name] MRN: [id number] DOB: 1956/09/09 Today's Date: 04/02/2012 Time: 4098-1191 PT Time Calculation (min): 19 min  PT Assessment / Plan / Recommendation Comments on Treatment Session  Patient progressing well with ambulation. No symptoms of nausea this session. Patient requested that her BSC be lowered so I did that for her but she did not want to sit on it at this time to see if the height was better.     Follow Up Recommendations  No PT follow up    Barriers to Discharge        Equipment Recommendations  Rolling walker with 5" wheels;3 in 1 bedside comode    Recommendations for Other Services    Frequency Min 5X/week   Plan Discharge plan remains appropriate;Frequency needs to be updated    Precautions / Restrictions Precautions Precautions: Back Precaution Comments: Patient able to recall all precautions and able to maintain with activities today   Pertinent Vitals/Pain 8/10 back pain    Mobility  Bed Mobility Bed Mobility: Not assessed (Patient OOB with nursing. States less A required) Transfers Sit to Stand: 4: Min guard;With armrests;With upper extremity assist;From chair/3-in-1 Stand to Sit: 4: Min guard;With upper extremity assist;To chair/3-in-1;With armrests Details for Transfer Assistance: Cues for safe technique and hand placement as patient attempts to pull up with both hands from RW. Cues to control descent onto chair Ambulation/Gait Ambulation/Gait Assistance: 4: Min guard Ambulation Distance (Feet): 170 Feet Assistive device: Rolling walker Ambulation/Gait Assistance Details: Cues for posture and safe proximity of RW Gait Pattern: Step-through pattern;Decreased stride length    Exercises     PT Diagnosis:    PT Problem List:   PT Treatment Interventions:     PT Goals Acute Rehab PT Goals PT Goal: Sit to Stand - Progress: Progressing toward goal PT Goal: Ambulate - Progress: Progressing toward  goal  Visit Information  Last PT Received On: 04/02/12 Assistance Needed: +1    Subjective Data      Cognition  Overall Cognitive Status: Appears within functional limits for tasks assessed/performed Arousal/Alertness: Awake/alert Orientation Level: Appears intact for tasks assessed Behavior During Session: [facility] for tasks performed    Balance     End of Session PT - End of Session Equipment Utilized During Treatment: Gait belt Activity Tolerance: Patient tolerated treatment well Patient left: in chair Nurse Communication: Mobility status    Fredrich Birks 04/02/2012, 9:52 AM 04/02/2012 Fredrich Birks PTA [phone] pager [phone] office

## 2012-04-02 NOTE — Progress Notes (Signed)
Agree with frequency update.  [place], Romeo DPT [phone]

## 2012-04-02 NOTE — Progress Notes (Signed)
Patient ID: Donna [name], female   DOB: [birth year], 56 y.o.   MRN: [id number] Doing well. Drain out. decfease of pain

## 2012-04-03 LAB — GLUCOSE, CAPILLARY: Glucose-Capillary: 159 mg/dL — ABNORMAL HIGH (ref 70–99)

## 2012-04-03 MED ORDER — ONDANSETRON HCL 4 MG PO TABS
4.0000 mg | ORAL_TABLET | Freq: Three times a day (TID) | ORAL | Status: DC | PRN
  Administered 2012-04-03: 4 mg via ORAL
  Filled 2012-04-03: qty 1

## 2012-04-03 MED FILL — Sodium Chloride IV Soln 0.9%: INTRAVENOUS | Qty: 1000 | Status: AC

## 2012-04-03 MED FILL — Sodium Chloride Irrigation Soln 0.9%: Qty: 3000 | Status: AC

## 2012-04-03 MED FILL — Heparin Sodium (Porcine) Inj 1000 Unit/ML: INTRAMUSCULAR | Qty: 30 | Status: AC

## 2012-04-03 NOTE — Progress Notes (Signed)
Orthopedic Tech Progress Note Patient Details:  MAUREENA DABBS July 01, 1956 [id number] Brace fitted by [employer] at 2:50. Patient ID: KANDIE KEIPER, female   DOB: Nov 30, 1955, 56 y.o.   MRN: [id number]   Jennye Moccasin 04/03/2012, 8:22 PM

## 2012-04-03 NOTE — Progress Notes (Signed)
Physical Therapy Treatment Patient Details Name: TYNSLEE BOWLDS MRN: [id number] DOB: 11/01/1955 Today's Date: 04/03/2012 Time: 0750-0808 PT Time Calculation (min): 18 min  PT Assessment / Plan / Recommendation Comments on Treatment Session  Patient progressing well with effiency with mobility. While ambulating patient became nauseated and had to be returned to room. Patient dry heaving. RN made aware.     Follow Up Recommendations  No PT follow up    Barriers to Discharge        Equipment Recommendations  None recommended by PT (patient has equipment at home)    Recommendations for Other Services    Frequency Min 5X/week   Plan Discharge plan remains appropriate;Frequency remains appropriate    Precautions / Restrictions Precautions Precautions: Back Precaution Comments: Patient able to recall precautions and maintain with mobility   Pertinent Vitals/Pain Patient became nauseated during ambulation    Mobility  Bed Mobility Right Sidelying to Sit: 5: Supervision;HOB flat Sitting - Scoot to Edge of Bed: 5: Supervision Details for Bed Mobility Assistance: Cues for safe technique Transfers Sit to Stand: 5: Supervision;With upper extremity assist;From bed;From chair/3-in-1 Stand to Sit: 5: Supervision;To chair/3-in-1;With armrests Details for Transfer Assistance: Cues for safe hand placement Ambulation/Gait Ambulation/Gait Assistance: 5: Supervision Ambulation Distance (Feet): 120 Feet Assistive device: Rolling walker Ambulation/Gait Assistance Details: Patient ambulating with good posture and safety Gait Pattern: Step-through pattern;Decreased stride length    Exercises     PT Diagnosis:    PT Problem List:   PT Treatment Interventions:     PT Goals Acute Rehab PT Goals PT Goal: Supine/Side to Sit - Progress: Progressing toward goal PT Goal: Sit to Stand - Progress: Progressing toward goal PT Goal: Ambulate - Progress: Progressing toward goal  Visit Information  Last PT Received On: 04/03/12 Assistance Needed: +1    Subjective Data      Cognition  Overall Cognitive Status: Appears within functional limits for tasks assessed/performed Arousal/Alertness: Awake/alert Orientation Level: Appears intact for tasks assessed Behavior During Session: [facility] for tasks performed    Balance     End of Session PT - End of Session Activity Tolerance: Patient tolerated treatment well;Treatment limited secondary to medical complications (Comment) (nausea) Patient left: in chair;with call bell/phone within reach Nurse Communication: Mobility status    Fredrich Birks 04/03/2012, 8:40 AM 04/03/2012 Fredrich Birks PTA [phone] pager [phone] office

## 2012-04-03 NOTE — Progress Notes (Signed)
Patient ID: Donna [name], female   DOB: 1956-05-31, 56 y.o.   MRN: [id number] Afebril, wound dry. Decrease of incisional pain, no weakness

## 2012-04-04 LAB — GLUCOSE, CAPILLARY: Glucose-Capillary: 91 mg/dL (ref 70–99)

## 2012-04-04 MED ORDER — OXYCODONE-ACETAMINOPHEN 5-325 MG PO TABS: 1.0000 | ORAL_TABLET | ORAL | Status: AC | PRN

## 2012-04-04 MED ORDER — CYCLOBENZAPRINE HCL 10 MG PO TABS: 10.0000 mg | ORAL_TABLET | Freq: Three times a day (TID) | ORAL | Status: AC | PRN

## 2012-04-04 NOTE — Progress Notes (Signed)
Physical Therapy Treatment Patient Details Name: Donna [name] MRN: [id number] DOB: Jul 11, 1956 Today's Date: 04/04/2012 Time: 4818-5631 PT Time Calculation (min): 19 min  PT Assessment / Plan / Recommendation Comments on Treatment Session  Pt with no nausea this session and she was able to ambulate an increased distance as well as perform stairs.Discussed various methods of stairs, pt and PT agreed on backwards stair with RW.    Follow Up Recommendations  No PT follow up    Barriers to Discharge        Equipment Recommendations  3 in 1 bedside comode;None recommended by PT    Recommendations for Other Services    Frequency Min 5X/week   Plan Discharge plan remains appropriate;Frequency remains appropriate    Precautions / Restrictions Precautions Precautions: Back Precaution Comments: Pt able to verbalize 3/3 back precautions. Required Braces or Orthoses: Spinal Brace Spinal Brace: Lumbar corset Restrictions Weight Bearing Restrictions: No       Mobility  Bed Mobility Bed Mobility: Rolling Right;Right Sidelying to Sit;Sitting - Scoot to [place] of Bed;Sit to Supine Rolling Right: 6: Modified independent (Device/Increase time) Rolling Left: 6: Modified independent (Device/Increase time) Right Sidelying to Sit: 6: Modified independent (Device/Increase time) Sitting - Scoot to Edge of Bed: 6: Modified independent (Device/Increase time) Sit to Supine: 6: Modified independent (Device/Increase time) Transfers Transfers: Sit to Stand;Stand to Sit Sit to Stand: 5: Supervision;With upper extremity assist;From bed;From chair/3-in-1 Stand to Sit: 5: Supervision;With upper extremity assist;To bed Details for Transfer Assistance: VC for safe hand placement and technique to maintain back precautions while standing Ambulation/Gait Ambulation/Gait Assistance: 5: Supervision Ambulation Distance (Feet): 150 Feet Assistive device: Rolling walker Ambulation/Gait Assistance Details: VC for  safe distance to RW. Gait Pattern: Step-through pattern;Decreased stride length Gait velocity: decreased gait speed Stairs: Yes Stairs Assistance: 4: Min assist;5: Supervision Stairs Assistance Details (indicate cue type and reason): VC for proper sequencing. With rails, pt supervision assist. Attempted stairs without rails, pt unable to complete without loss of balance and min-mod assist. VC for proper sequencing with RW, pt with increased steadiness and safety. Stair Management Technique: Two rails;Forwards;Step to pattern;No rails;With walker;Backwards Number of Stairs: 5  ( two with RW) Wheelchair Mobility Wheelchair Mobility: No     PT Goals Acute Rehab PT Goals PT Goal Formulation: With patient PT Goal: Supine/Side to Sit - Progress: Met PT Goal: Sit to Stand - Progress: Progressing toward goal PT Goal: Ambulate - Progress: Met PT Goal: Up/Down Stairs - Progress: Met  Visit Information  Last PT Received On: 04/04/12 Assistance Needed: +1    Subjective Data      Cognition  Overall Cognitive Status: Appears within functional limits for tasks assessed/performed Arousal/Alertness: Awake/alert Orientation Level: Appears intact for tasks assessed Behavior During Session: [facility] for tasks performed    Balance     End of Session PT - End of Session Equipment Utilized During Treatment: Gait belt;Back brace Activity Tolerance: Patient tolerated treatment well;Treatment limited secondary to medical complications (Comment) Patient left: in bed;with call bell/phone within reach Nurse Communication: Mobility status    Milana Kidney 04/04/2012, 9:47 AM  04/04/2012 Milana Kidney DPT PAGER: [phone] OFFICE: [phone]

## 2012-04-04 NOTE — Discharge Summary (Signed)
Physician Discharge Summary  Patient ID: [name] MRN: [id number] DOB/AGE: [birth year] 56 y.o.  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 *    Discharged Condition: good  Hospital Course: Patient was admitted to undergo surgical decompression arthrodesis of L1-2 and L2-3 on 03/31/2012 she tolerated this procedure well.  Consults: None  Significant Diagnostic Studies: None  Treatments: surgery: Decompression L1-2 and L2-3 posterior lateral arthrodesis L1 and L3 with pedicle screw fixation.  Discharge Exam: Blood pressure 102/64, pulse 100, temperature 99.1 F (37.3 C), temperature source Oral, resp. rate 18, height 5\' 3"  (1.6 m), weight 107.5 kg (236 lb 15.9 oz), SpO2 91.00%. incision is clean and dry motor function is intact in both lower extremities ambulatoryy staattus  somewhatt limmited  Disposition: Home  Discharge Orders    Future Appointments: Provider: Department: Dept Phone: Center:   04/29/2012 10:00 AM Waymon Budge, MD [facility] [phone] None     Future Orders Please Complete By Expires   Diet - low sodium heart healthy      Increase activity slowly      Discharge instructions      Comments:   Straight walk straight stand straight. Okay to shower. Mind your posture.   Call MD for:  redness, tenderness, or signs of infection (pain, swelling, redness, odor or green/yellow discharge around incision site)      Call MD for:  severe uncontrolled pain      Call MD for:  temperature >100.4        Medication List  As of 04/04/2012 10:29 AM   TAKE these medications         albuterol 108 (90 BASE) MCG/ACT inhaler   Commonly known as: PROVENTIL HFA;VENTOLIN HFA   Inhale 2 puffs into the lungs every 6 (six) hours as needed. Shortness of breath      ALPRAZolam 0.5 MG tablet   Commonly known as: XANAX   Take 0.5 mg by mouth at bedtime as needed. Sleep/anxiety.      benazepril 40 MG tablet   Commonly known as: LOTENSIN   Take 40 mg by mouth daily.      cyclobenzaprine 10 MG tablet   Commonly known as: FLEXERIL   Take 1 tablet (10 mg total) by mouth 3 (three) times daily as needed for muscle spasms.      esomeprazole 20 MG capsule   Commonly known as: NEXIUM   Take 20 mg by mouth daily before breakfast.      Fluticasone-Salmeterol 250-50 MCG/DOSE Aepb   Commonly known as: ADVAIR   Inhale 1 puff into the lungs every 12 (twelve) hours.      glipiZIDE 10 MG tablet   Commonly known as: GLUCOTROL   Take 10 mg by mouth 2 (two) times daily before a meal.      levothyroxine 100 MCG tablet   Commonly known as: SYNTHROID, LEVOTHROID   Take 100 mcg by mouth daily.      metFORMIN 1000 MG tablet   Commonly known as: GLUCOPHAGE   Take 1,000 mg by mouth 2 (two) times daily with a meal.      oxyCODONE-acetaminophen 5-325 MG per tablet   Commonly known as: PERCOCET   Take 1-2 tablets by mouth every 4 (four) hours as needed for pain.      pravastatin 20 MG tablet  Commonly known as: PRAVACHOL   Take 20 mg by mouth daily.      tiotropium 18 MCG inhalation capsule   Commonly known as: SPIRIVA   Place 18 mcg into inhaler and inhale daily.             SignedStefani Dama 04/04/2012, 10:29 AM

## 2012-04-04 NOTE — Progress Notes (Signed)
Occupational Therapy Treatment Patient Details Name: Donna [name] MRN: [id number] DOB: [birth year] Today's Date: 04/04/2012 Time: 0800-0810 OT Time Calculation (min): 10 min  OT Assessment / Plan / Recommendation Comments on Treatment Session Session limited by pt declining OOB activity.  Focus of session on ADL education while useing proper body mechanics and maintaing back precautions.  Pt able to verbalize safe techniques to independently perform ADLs.    Follow Up Recommendations  Supervision/Assistance - 24 hour    Barriers to Discharge       Equipment Recommendations  3 in 1 bedside comode    Recommendations for Other Services    Frequency Min 2X/week   Plan Discharge plan remains appropriate    Precautions / Restrictions Precautions Precautions: Back Precaution Comments: Pt able to independently verbalize 3/3 back precautions. Required Braces or Orthoses: Spinal Brace Spinal Brace: Lumbar corset   Pertinent Vitals/Pain See vitals    ADL  Grooming: Performed;Wash/dry face;Set up Where Assessed - Grooming: Supine, head of bed up Lower Body Bathing: Simulated;Supervision/safety Where Assessed - Lower Body Bathing: Supine, head of bed up (knees flexed ) Lower Body Dressing: Simulated;Supervision/safety Where Assessed - Lower Body Dressing: Supine, head of bed up (knees flexed) ADL Comments: Pt declining OOB activity at this time due to having just returned to bed from bathroom. Encouraged pt to sit EOB during session, but pt decline.  OT completed educated session while pt supine in bed. Educated pt on LB dressing/bathing techniques while either laying supine in bed with knees flexed or sitting EOB with ankles crossed over legs.  Pt able to return demonstration of LB ADL technique supine in bed with knees flexed.  Educated pt on keeping items at countertop level to assist in adhering to back precautions during ADL activities.  Pt able to independently verbalize proper  techniques for toileting hygiene and grooming ADLs.    OT Diagnosis:    OT Problem List:   OT Treatment Interventions:     OT Goals ADL Goals Pt Will Perform Grooming: with set-up;Standing at sink ADL Goal: Grooming - Progress: Progressing toward goals Miscellaneous OT Goals Miscellaneous OT Goal #1: Pt will perform bed mobility with supervision in prep for EOB ADLs. OT Goal: Miscellaneous Goal #1 - Progress: Progressing toward goals Miscellaneous OT Goal #2: Pt will independently verbalize and demonstrate 3/3 back precautions during all ADL activity. OT Goal: Miscellaneous Goal #2 - Progress: Progressing toward goals  Visit Information  Last OT Received On: 04/04/12    Subjective Data      Prior Functioning       Cognition  Overall Cognitive Status: Appears within functional limits for tasks assessed/performed Arousal/Alertness: Awake/alert Orientation Level: Appears intact for tasks assessed Behavior During Session: [facility] for tasks performed    Mobility Bed Mobility Rolling Left: 6: Modified independent (Device/Increase time)   Exercises    Balance    End of Session OT - End of Session Activity Tolerance: Patient limited by fatigue Patient left: in bed;with call bell/phone within reach  04/04/2012 Cipriano Mile OTR/L Pager [phone] Office [phone]  Cipriano Mile 04/04/2012, 8:34 AM

## 2012-04-29 ENCOUNTER — Institutional Professional Consult (permissible substitution): Payer: BC Managed Care – PPO | Admitting: Internal Medicine

## 2012-05-06 ENCOUNTER — Ambulatory Visit: Payer: BC Managed Care – PPO | Attending: Neurosurgery

## 2012-05-06 DIAGNOSIS — IMO0001 Reserved for inherently not codable concepts without codable children: Secondary | ICD-10-CM | POA: Insufficient documentation

## 2012-05-06 DIAGNOSIS — M545 Low back pain, unspecified: Secondary | ICD-10-CM | POA: Insufficient documentation

## 2012-05-06 DIAGNOSIS — R262 Difficulty in walking, not elsewhere classified: Secondary | ICD-10-CM | POA: Insufficient documentation

## 2012-05-12 ENCOUNTER — Encounter: Payer: BC Managed Care – PPO | Admitting: Rehabilitation

## 2012-05-14 ENCOUNTER — Ambulatory Visit: Payer: BC Managed Care – PPO | Admitting: Rehabilitation

## 2012-05-19 ENCOUNTER — Ambulatory Visit: Payer: BC Managed Care – PPO | Admitting: Rehabilitation

## 2012-05-20 ENCOUNTER — Encounter: Payer: Self-pay | Admitting: Pulmonary Disease

## 2012-05-20 ENCOUNTER — Ambulatory Visit (INDEPENDENT_AMBULATORY_CARE_PROVIDER_SITE_OTHER): Payer: BC Managed Care – PPO | Admitting: Pulmonary Disease

## 2012-05-20 ENCOUNTER — Other Ambulatory Visit: Payer: BC Managed Care – PPO

## 2012-05-20 VITALS — BP 122/62 | HR 94 | Temp 97.6°F | Ht 62.0 in | Wt 224.2 lb

## 2012-05-20 DIAGNOSIS — J449 Chronic obstructive pulmonary disease, unspecified: Secondary | ICD-10-CM

## 2012-05-20 MED ORDER — BUDESONIDE-FORMOTEROL FUMARATE 160-4.5 MCG/ACT IN AERO: 2.0000 | INHALATION_SPRAY | Freq: Two times a day (BID) | RESPIRATORY_TRACT | Status: DC

## 2012-05-20 MED ORDER — AEROCHAMBER MV MISC: Status: AC

## 2012-05-20 NOTE — Patient Instructions (Signed)
Stop advair Symbicort two puffs twice per day, and rinse mouth after each use Spiriva one puff daily Albuterol two puffs as needed for cough, wheeze, or chest congestion Lab test today Follow up in one month

## 2012-05-20 NOTE — Progress Notes (Deleted)
  Subjective:    Patient ID: Donna [name], female    DOB: October 21, 1956, 56 y.o.   MRN: [id number]  HPI    Review of Systems  Constitutional: Negative for fever, appetite change and unexpected weight change.  HENT: Positive for congestion. Negative for ear pain, sore throat, sneezing, trouble swallowing and dental problem.   Respiratory: Positive for cough and shortness of breath.   Cardiovascular: Negative for chest pain, palpitations and leg swelling.  Gastrointestinal: Negative for abdominal pain.  Musculoskeletal: Positive for arthralgias. Negative for joint swelling.  Skin: Negative for rash.  Neurological: Negative for headaches.  Psychiatric/Behavioral: Negative for dysphoric mood. The patient is not nervous/anxious.        Objective:   Physical Exam        Assessment & Plan:

## 2012-05-20 NOTE — Progress Notes (Signed)
Chief Complaint  Patient presents with  . Pulmonary Consult    refer Dr. Dareen Piano. Pt c/o increase SOB at rest and w/ activity, "lungs feel tight", only coughs when out in heat and activity    History of Present Illness: Donna [name] is a 56 y.o. female former smoker for evaluation of dyspnea with history of COPD.  She has noticed trouble with her breathing for years.  This has been getting progressively worse over the past 6 months.  She can now only walk about 50 feet before she has to stop to catch her breath.  She feels like her lungs are tight, and she can't get enough air in.  She does not have much cough, and is not wheezing.  She had her advair dose increased to 250/50 recently, but is not sure this has helped much.  She has been on prednisone, and this helped some.  However, her symptoms recurred once she was taken off prednisone.  She has been using spiriva for the past two months, and this has helped.  She has to use albuterol several times per day.  She used to get asthmatic bronchitis a lot before she stopped smoking.  She has noticed problem with her breathing around strong smells.  There is no history of seasonal allergies or aspirin sensitivity.    She started smoking at age 89, and smoked up to 3 packs per day.  She quit in 2005.  She also had second hand exposure from her father.  Her sister was diagnosed with emphysema in her 51's.  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  There is no history of pneumonia or exposure to tuberculosis.  She denies significant animal exposures.  She has noticed her breathing is worse in hot, humid weather.  She does better with cool air.  Of note is that she was taking lotensin.  This was not on her recent medication list, and so she stopped taking it.  She has not noticed any difference with her breathing after stopping this.    Past Medical History  Diagnosis Date  . Hypertension     takes Benazepril daily  .  Hyperlipidemia     takes Pravastatin daily  . COPD (chronic obstructive pulmonary disease)     recently diagnosed   . Bronchitis     hx of-many yrs ago  . Arthritis     back   . Chronic back pain   . Diverticulosis   . GERD (gastroesophageal reflux disease)     takes Nexium daily  . Hypothyroidism     takes SYnthroid daily  . Anxiety     takes Xanax prn  . Diabetes mellitus     takes Metformin and GLipizide daily    Past Surgical History  Procedure Date  . Cesarean section     x 2  . Hemorrhoid surgery   . Cholecystectomy   . Colonoscopy   . Back surgery   . Vaginal hysterectomy   . Tubal ligation     Current Outpatient Prescriptions on File Prior to Visit  Medication Sig Dispense Refill  . albuterol (PROVENTIL HFA;VENTOLIN HFA) 108 (90 BASE) MCG/ACT inhaler Inhale 2 puffs into the lungs every 6 (six) hours as needed. Shortness of breath      . ALPRAZolam (XANAX) 0.5 MG tablet Take 0.5 mg by mouth at bedtime as needed. Sleep/anxiety.      Marland Kitchen escitalopram (LEXAPRO) 20 MG tablet Once a day      .  glipiZIDE (GLUCOTROL) 10 MG tablet Take 10 mg by mouth daily.       Marland Kitchen levothyroxine (SYNTHROID, LEVOTHROID) 100 MCG tablet Take 100 mcg by mouth daily.      . metFORMIN (GLUCOPHAGE) 1000 MG tablet Take 1,000 mg by mouth 2 (two) times daily with a meal.      . pravastatin (PRAVACHOL) 20 MG tablet Take 20 mg by mouth daily.      Marland Kitchen tiotropium (SPIRIVA) 18 MCG inhalation capsule Place 18 mcg into inhaler and inhale daily.      . budesonide-formoterol (SYMBICORT) 160-4.5 MCG/ACT inhaler Inhale 2 puffs into the lungs 2 (two) times daily.  1 Inhaler  5    Allergies  Allergen Reactions  . Other Other (See Comments)    Steri strip breaks out    Family History  Problem Relation Age of Onset  . Anesthesia problems Neg Hx   . Hypotension Neg Hx   . Malignant hyperthermia Neg Hx   . Pseudochol deficiency Neg Hx   . Lung cancer Father   . Heart disease Mother     History    Substance Use Topics  . Smoking status: Former Smoker -- 3.0 packs/day for 30 years    Types: Cigarettes    Quit date: 03/31/2006  . Smokeless tobacco: Not on file  . Alcohol Use: No    Review of Systems  Constitutional: Negative for fever, appetite change and unexpected weight change.  HENT: Positive for congestion. Negative for ear pain, sore throat, sneezing, trouble swallowing and dental problem.   Respiratory: Positive for cough and shortness of breath.   Cardiovascular: Negative for chest pain, palpitations and leg swelling.  Gastrointestinal: Negative for abdominal pain.  Musculoskeletal: Positive for arthralgias. Negative for joint swelling.  Skin: Negative for rash.  Neurological: Negative for headaches.  Psychiatric/Behavioral: Negative for dysphoric mood. The patient is not nervous/anxious.     Physical Exam: Filed Vitals:   05/20/12 1553 05/20/12 1554  BP:  122/62  Pulse:  94  Temp: 97.6 F (36.4 C)   TempSrc: Oral   Height: 5\' 2"  (1.575 m)   Weight: 224 lb 3.2 oz (101.696 kg)   SpO2:  94%    Wt Readings from Last 3 Encounters:  05/20/12 224 lb 3.2 oz (101.696 kg)  03/31/12 236 lb 15.9 oz (107.5 kg)  03/31/12 236 lb 15.9 oz (107.5 kg)    Body mass index is 41.01 kg/(m^2).   General - No distress ENT - TM clear, no sinus tenderness, no oral exudate, no LAN, no thyromegaly Cardiac - s1s2 regular, no murmur, pulses symmetric, no edema Chest - normal respiratory excursion, good air entry, no wheeze/rales/dullness Back - no focal tenderness Abd - soft, non-tender, no organomegaly, + bowel sounds Ext - normal motor strength Neuro - Cranial nerves are normal. PERLA. EOM's intact. Skin - no discernible active dermatitis, erythema, urticaria or inflammatory process. Psych - normal mood, and behavior.  Spirometry 02/18/12>>FEV1 1.01 (38%), FEV1% 58  03/25/2012  *RADIOLOGY REPORT*   Clinical Data: Preoperative respiratory exam; lumbar HNP, COPD and previous  smoker   CHEST - 2 VIEW   Comparison: None.   Findings: The cardiac silhouette, mediastinum, pulmonary vasculature are within normal limits.  Both lungs are clear. There is no acute bony abnormality.   IMPRESSION: There is no evidence of acute cardiac or pulmonary process.   Original Report Authenticated By: Brandon Melnick, M.D.    Lab Results  Component Value Date   WBC 7.3 03/25/2012  HGB 13.4 03/25/2012   HCT 41.3 03/25/2012   MCV 85.5 03/25/2012   PLT 211 03/25/2012    Lab Results  Component Value Date   CREATININE 0.81 03/25/2012   BUN 15 03/25/2012   NA 139 03/25/2012   K 5.0 03/25/2012   CL 102 03/25/2012   CO2 27 03/25/2012    Assessment/Plan:  Coralyn Helling, MD [facility] Pager:  [phone] 05/21/2012, 12:18 PM  Patient Instructions  Stop advair Symbicort two puffs twice per day, and rinse mouth after each use Spiriva one puff daily Albuterol two puffs as needed for cough, wheeze, or chest congestion Lab test today Follow up in one month

## 2012-05-21 ENCOUNTER — Ambulatory Visit: Payer: BC Managed Care – PPO | Admitting: Rehabilitation

## 2012-05-21 NOTE — Assessment & Plan Note (Signed)
She has extensive history of smoking.  She has progressive dyspnea.  She recent spirometry showing airflow obstruction.  She has family history of emphysema.  Her current symptoms are likely related to her COPD.  Will continue spiriva and prn albuterol.  Will try her on symbicort in place of advair.  Demonstrated proper inhaler technique, and advised her to use spacer device with symbicort and albuterol.  I don't think she needs prednisone or antibiotics at present.  She had recent chest xray which was unremarkable, and I don't think she needs additional imaging studies at present.  Will check alpha 1 anti-trypsin level.  Will re-assess her status at next visit, and then likely arrange for full PFT's to further assess.  Will also arrange for overnight oximetry on room air.  Explained that if her breathing is not improved at next visit, then she may need additional cardiac evaluation.

## 2012-05-26 LAB — ALPHA-1 ANTITRYPSIN PHENOTYPE

## 2012-05-27 ENCOUNTER — Telehealth: Payer: Self-pay | Admitting: Pulmonary Disease

## 2012-05-27 ENCOUNTER — Ambulatory Visit: Payer: BC Managed Care – PPO | Admitting: Rehabilitative and Restorative Service Providers"

## 2012-05-27 DIAGNOSIS — J449 Chronic obstructive pulmonary disease, unspecified: Secondary | ICD-10-CM

## 2012-05-27 NOTE — Telephone Encounter (Signed)
I spoke with patient about results and she verbalized understanding and had no questions 

## 2012-05-27 NOTE — Telephone Encounter (Signed)
A1AT level 05/20/12>> 131 (nl 83-199), MM phenotype.   Will have my nurse inform patient that blood test was normal.  She does not have inherited condition that could cause emphysema.

## 2012-06-01 ENCOUNTER — Ambulatory Visit: Payer: BC Managed Care – PPO | Attending: Neurosurgery | Admitting: Rehabilitative and Restorative Service Providers"

## 2012-06-01 DIAGNOSIS — IMO0001 Reserved for inherently not codable concepts without codable children: Secondary | ICD-10-CM | POA: Insufficient documentation

## 2012-06-01 DIAGNOSIS — M545 Low back pain, unspecified: Secondary | ICD-10-CM | POA: Insufficient documentation

## 2012-06-01 DIAGNOSIS — R262 Difficulty in walking, not elsewhere classified: Secondary | ICD-10-CM | POA: Insufficient documentation

## 2012-06-03 ENCOUNTER — Ambulatory Visit: Payer: BC Managed Care – PPO | Admitting: Rehabilitation

## 2012-06-09 ENCOUNTER — Ambulatory Visit: Payer: BC Managed Care – PPO | Admitting: Rehabilitation

## 2012-06-11 ENCOUNTER — Encounter: Payer: BC Managed Care – PPO | Admitting: Rehabilitation

## 2012-06-16 ENCOUNTER — Ambulatory Visit: Payer: BC Managed Care – PPO | Admitting: Rehabilitative and Restorative Service Providers"

## 2012-06-18 ENCOUNTER — Encounter: Payer: BC Managed Care – PPO | Admitting: Rehabilitative and Restorative Service Providers"

## 2012-06-23 ENCOUNTER — Encounter: Payer: Self-pay | Admitting: Pulmonary Disease

## 2012-06-23 ENCOUNTER — Ambulatory Visit (INDEPENDENT_AMBULATORY_CARE_PROVIDER_SITE_OTHER): Payer: BC Managed Care – PPO | Admitting: Pulmonary Disease

## 2012-06-23 VITALS — BP 102/60 | HR 95 | Temp 97.7°F | Ht 62.0 in | Wt 219.4 lb

## 2012-06-23 DIAGNOSIS — J449 Chronic obstructive pulmonary disease, unspecified: Secondary | ICD-10-CM

## 2012-06-23 MED ORDER — ALBUTEROL SULFATE HFA 108 (90 BASE) MCG/ACT IN AERS: 2.0000 | INHALATION_SPRAY | Freq: Four times a day (QID) | RESPIRATORY_TRACT | Status: DC | PRN

## 2012-06-23 NOTE — Assessment & Plan Note (Signed)
She is to continue spiriva, symbicort, and prn albuterol.  Will arrange for full PFT's.  Will arrange for ONO on room air.    She will need referral to pulmonary rehab once she is fully recovered from her back surgery.

## 2012-06-23 NOTE — Progress Notes (Signed)
Chief Complaint  Patient presents with  . Follow-up    breathing has unchanged.  denies any wheezing, chest tx, cough    History of Present Illness: Donna [name] is a 56 y.o. female former smoker with COPD.  A1AT level 05/20/12>> 131 (nl 83-199), MM phenotype.  She feels her inhalers have helped.  She is using albuterol 2 to 3 times per week.  She is not having much cough, wheeze, or chest congestion.  She is no longer wearing her back brace.  She is doing rehab with pool exercises.   Past Medical History  Diagnosis Date  . Hypertension     takes Benazepril daily  . Hyperlipidemia     takes Pravastatin daily  . COPD (chronic obstructive pulmonary disease)     recently diagnosed   . Bronchitis     hx of-many yrs ago  . Arthritis     back   . Chronic back pain   . Diverticulosis   . GERD (gastroesophageal reflux disease)     takes Nexium daily  . Hypothyroidism     takes SYnthroid daily  . Anxiety     takes Xanax prn  . Diabetes mellitus     takes Metformin and GLipizide daily    Past Surgical History  Procedure Date  . Cesarean section     x 2  . Hemorrhoid surgery   . Cholecystectomy   . Colonoscopy   . Back surgery   . Vaginal hysterectomy   . Tubal ligation     Outpatient Encounter Prescriptions as of 06/23/2012  Medication Sig Dispense Refill  . albuterol (PROVENTIL HFA;VENTOLIN HFA) 108 (90 BASE) MCG/ACT inhaler Inhale 2 puffs into the lungs every 6 (six) hours as needed. Shortness of breath      . ALPRAZolam (XANAX) 0.5 MG tablet Take 0.5 mg by mouth at bedtime as needed. Sleep/anxiety.      . budesonide-formoterol (SYMBICORT) 160-4.5 MCG/ACT inhaler Inhale 2 puffs into the lungs 2 (two) times daily.  1 Inhaler  5  . escitalopram (LEXAPRO) 20 MG tablet Once a day      . glipiZIDE (GLUCOTROL) 10 MG tablet Take 10 mg by mouth daily.       Marland Kitchen levothyroxine (SYNTHROID, LEVOTHROID) 100 MCG tablet Take 100 mcg by mouth daily.      . metFORMIN (GLUCOPHAGE) 1000  MG tablet Take 1,000 mg by mouth 2 (two) times daily with a meal.      . oxyCODONE (ROXICODONE) 15 MG immediate release tablet 2-3 times a day      . pravastatin (PRAVACHOL) 20 MG tablet Take 20 mg by mouth daily.      Marland Kitchen Spacer/Aero-Holding Chambers (AEROCHAMBER MV) inhaler Use as instructed  1 each  0  . tiotropium (SPIRIVA) 18 MCG inhalation capsule Place 18 mcg into inhaler and inhale daily.      Marland Kitchen DISCONTD: cyclobenzaprine (FLEXERIL) 10 MG tablet 1 every 6 hrs        Allergies  Allergen Reactions  . Other Other (See Comments)    Steri strip breaks out    Physical Exam:  Filed Vitals:   06/23/12 1441 06/23/12 1442  BP:  102/60  Pulse:  95  Temp: 97.7 F (36.5 C)   TempSrc: Oral   Height: 5\' 2"  (1.575 m)   Weight: 219 lb 6.4 oz (99.519 kg)   SpO2:  96%   Body mass index is 40.13 kg/(m^2). Wt Readings from Last 2 Encounters:  06/23/12 219 lb 6.4 oz (  99.519 kg)  05/20/12 224 lb 3.2 oz (101.696 kg)   General - No distress  ENT - no sinus tenderness, no oral exudate, no LAN, no thyromegaly  Cardiac - s1s2 regular, no murmur, pulses symmetric, no edema  Chest - normal respiratory excursion, good air entry, no wheeze/rales/dullness  Back - no focal tenderness  Abd - soft, non-tender, no organomegaly, + bowel sounds  Ext - normal motor strength  Neuro - Cranial nerves are normal. PERLA. EOM's intact.  Skin - no discernible active dermatitis, erythema, urticaria or inflammatory process.  Psych - normal mood, and behavior.  Assessment/Plan:  Coralyn Helling, MD [place] [facility] Pager:  [phone] 06/23/2012, 2:49 PM

## 2012-06-23 NOTE — Patient Instructions (Signed)
Will arrange for oxygen test and breathing test (PFT)>>will call with results Follow up in 4 months

## 2012-07-06 ENCOUNTER — Telehealth: Payer: Self-pay | Admitting: Pulmonary Disease

## 2012-07-06 DIAGNOSIS — J449 Chronic obstructive pulmonary disease, unspecified: Secondary | ICD-10-CM

## 2012-07-07 ENCOUNTER — Ambulatory Visit (INDEPENDENT_AMBULATORY_CARE_PROVIDER_SITE_OTHER): Payer: BC Managed Care – PPO | Admitting: Pulmonary Disease

## 2012-07-07 DIAGNOSIS — J449 Chronic obstructive pulmonary disease, unspecified: Secondary | ICD-10-CM

## 2012-07-07 LAB — PULMONARY FUNCTION TEST

## 2012-07-07 NOTE — Telephone Encounter (Signed)
I called and spoke with Larita Fife and she is going to fax ONO to triage. Will await fax

## 2012-07-07 NOTE — Progress Notes (Signed)
PFT done today. 

## 2012-07-07 NOTE — Telephone Encounter (Signed)
Received results and was placed in Dr. Evlyn Courier look at. Please advise Dr. Craige Cotta thanks

## 2012-07-07 NOTE — Telephone Encounter (Signed)
I have not received ONO.  Can we check status of this please.

## 2012-07-08 NOTE — Telephone Encounter (Signed)
RA ONO 06/26/12>>Test time 8 hrs 18 min.  Mean SpO2 94%, low SpO2 84%.  Spent 16 sec with SpO2 < 88%.  PFT 07/07/12>>FEV1 1.69 (73%), FEV1% 50, TLC 5.33 (112%), DLCO 48%, no BD.  Spoke with pt's husband.  Informed that PFT's are consistent with COPD.  No change to inhaler regimen.  Explained that she does not need supplemental oxygen based on ONO results.

## 2012-07-17 ENCOUNTER — Encounter: Payer: Self-pay | Admitting: Pulmonary Disease

## 2012-12-07 ENCOUNTER — Ambulatory Visit: Payer: BC Managed Care – PPO | Admitting: Pulmonary Disease

## 2012-12-23 ENCOUNTER — Ambulatory Visit (INDEPENDENT_AMBULATORY_CARE_PROVIDER_SITE_OTHER)
Admission: RE | Admit: 2012-12-23 | Discharge: 2012-12-23 | Disposition: A | Discharge status: Home / Self Care | Payer: BC Managed Care – PPO | Source: Ambulatory Visit | Attending: Pulmonary Disease | Admitting: Pulmonary Disease

## 2012-12-23 ENCOUNTER — Ambulatory Visit (INDEPENDENT_AMBULATORY_CARE_PROVIDER_SITE_OTHER): Payer: BC Managed Care – PPO | Admitting: Pulmonary Disease

## 2012-12-23 ENCOUNTER — Encounter: Payer: Self-pay | Admitting: Pulmonary Disease

## 2012-12-23 VITALS — BP 124/82 | HR 102 | Temp 97.0°F | Ht 61.0 in | Wt 226.8 lb

## 2012-12-23 DIAGNOSIS — R0609 Other forms of dyspnea: Secondary | ICD-10-CM

## 2012-12-23 DIAGNOSIS — R0600 Dyspnea, unspecified: Secondary | ICD-10-CM

## 2012-12-23 DIAGNOSIS — J449 Chronic obstructive pulmonary disease, unspecified: Secondary | ICD-10-CM

## 2012-12-23 DIAGNOSIS — J441 Chronic obstructive pulmonary disease with (acute) exacerbation: Secondary | ICD-10-CM

## 2012-12-23 MED ORDER — PREDNISONE 10 MG PO TABS: ORAL_TABLET | ORAL | Status: DC

## 2012-12-23 NOTE — Patient Instructions (Signed)
Prednisone 10 mg pill >> 3 pills for 2 days, 2 pills for 2 days, 1 pill for 2 days, 1/2 pill for 2 days Will schedule Echocardiogram Follow up in 4 weeks

## 2012-12-23 NOTE — Progress Notes (Signed)
Chief Complaint  Patient presents with  . Follow-up    Pt states her breathing is worse. If she spends a lot of time outside and it is cold she will cough. Will have occasional wheezing but the rescue inhaler helps. Chest is very tx.    History of Present Illness: Donna [name] is a 57 y.o. female former smoker with COPD.  She continues to have trouble with her breathing.  This happens all the time, including at rest.  She feels like only half her lungs are working, and gets tightness in her chest.  Her breathing is worse in cold air.  She has some cough and wheeze, but not as much as before.  She denies fever, gland swelling, or leg swelling.  She notices more trouble with her breathing in hot showers.  She uses her spiriva daily, and symbicort twice daily.  She uses her proventil 3 times per day, and this helps.  She denies chest pain or palpitations.  TESTS: A1AT level 05/20/12 >> 131 (nl 83-199), MM phenotype. RA ONO 06/26/12 >> Test time 8 hrs 18 min.  Mean SpO2 94%, low SpO2 84%.  Spent 16 sec with SpO2 < 88%. PFT 07/07/12 >> FEV1 1.69 (73%), FEV1% 50, TLC 5.33 (112%), DLCO 48%, no BD. Spirometry 12/23/12 >> FEV1 1.20 (51%), FEV1% 62  Donna [name]  has a past medical history of Hypertension; Hyperlipidemia; COPD (chronic obstructive pulmonary disease); Bronchitis; Arthritis; Chronic back pain; Diverticulosis; GERD (gastroesophageal reflux disease); Hypothyroidism; Anxiety; and Diabetes mellitus.  [name]  has past surgical history that includes Cesarean section; Hemorrhoid surgery; Cholecystectomy; Colonoscopy; Back surgery; Vaginal hysterectomy; and Tubal ligation.  Prior to Admission medications   Medication Sig Start Date End Date Taking? Authorizing Provider  albuterol (PROVENTIL HFA;VENTOLIN HFA) 108 (90 BASE) MCG/ACT inhaler Inhale 2 puffs into the lungs every 6 (six) hours as needed (cough, wheeze, or chest congestion). Shortness of breath 06/23/12  Yes Coralyn Helling, MD   ALPRAZolam Prudy Feeler) 0.5 MG tablet Take 0.5 mg by mouth at bedtime as needed. Sleep/anxiety.   Yes Historical Provider, MD  budesonide-formoterol (SYMBICORT) 160-4.5 MCG/ACT inhaler Inhale 2 puffs into the lungs 2 (two) times daily. 05/20/12 05/20/13 Yes Coralyn Helling, MD  escitalopram (LEXAPRO) 20 MG tablet Once a day 02/12/12  Yes Historical Provider, MD  glipiZIDE (GLUCOTROL) 10 MG tablet Take 10 mg by mouth daily.    Yes Historical Provider, MD  levothyroxine (SYNTHROID, LEVOTHROID) 100 MCG tablet Take 100 mcg by mouth daily.   Yes Historical Provider, MD  metFORMIN (GLUCOPHAGE) 1000 MG tablet Take 1,000 mg by mouth 2 (two) times daily with a meal.   Yes Historical Provider, MD  pravastatin (PRAVACHOL) 20 MG tablet Take 20 mg by mouth daily.   Yes Historical Provider, MD  Spacer/Aero-Holding Chambers (AEROCHAMBER MV) inhaler Use as instructed 05/20/12 05/20/13 Yes Coralyn Helling, MD  tiotropium (SPIRIVA) 18 MCG inhalation capsule Place 18 mcg into inhaler and inhale daily.   Yes Historical Provider, MD    Allergies  Allergen Reactions  . Other Other (See Comments)    Steri strip breaks out     Physical Exam:  General - No distress ENT - No sinus tenderness, no oral exudate, no LAN Cardiac - s1s2 regular, no murmur Chest - Diminished breath sounds, no wheeze/rales Back - No focal tenderness Abd - Soft, non-tender Ext - No edema Neuro - Normal strength Skin - No rashes Psych - normal mood, and behavior  12/23/2012  *RADIOLOGY REPORT*  Clinical Data: Progressive dyspnea.   History of COPD.   CHEST - 2 VIEW   Comparison: 03/25/2012   Findings: Normal heart size.  No pleural effusion or edema.  There is no airspace consolidation noted.  Scar like density noted in the left base.   IMPRESSION:   1.  No acute cardiopulmonary abnormalities.    Original Report Authenticated By: Signa Kell, M.D.    ECG 12/23/12 - Sinus rhythm    Assessment/Plan:  Coralyn Helling, MD [place]  Pulmonary/Critical Care/Sleep Pager:  [phone] 12/23/2012, 3:15 PM

## 2012-12-29 NOTE — Assessment & Plan Note (Addendum)
She has continued dyspnea.  Her chest xray was unrevealing, and ECG was unrevealing.  She had decreased values on spirometry, but this is difficult to interpret fully due to difficult patient effort.  Will give her course of prednisone to see if this improves her dyspnea.  Will arrange for Echo to assess LV and RV systolic function as well as pulmonary artery pressures.  She is to continue spiriva, symbicort, and prn albuterol.  Likely some of her dyspnea is related to her weight and deconditioning also.  She may need CPST to further assess.

## 2012-12-31 ENCOUNTER — Ambulatory Visit (HOSPITAL_COMMUNITY): Payer: BC Managed Care – PPO | Attending: Cardiology | Admitting: Radiology

## 2012-12-31 DIAGNOSIS — J449 Chronic obstructive pulmonary disease, unspecified: Secondary | ICD-10-CM | POA: Insufficient documentation

## 2012-12-31 DIAGNOSIS — R0600 Dyspnea, unspecified: Secondary | ICD-10-CM

## 2012-12-31 DIAGNOSIS — J4489 Other specified chronic obstructive pulmonary disease: Secondary | ICD-10-CM | POA: Insufficient documentation

## 2012-12-31 DIAGNOSIS — R0609 Other forms of dyspnea: Secondary | ICD-10-CM | POA: Insufficient documentation

## 2012-12-31 DIAGNOSIS — E785 Hyperlipidemia, unspecified: Secondary | ICD-10-CM | POA: Insufficient documentation

## 2012-12-31 DIAGNOSIS — E119 Type 2 diabetes mellitus without complications: Secondary | ICD-10-CM | POA: Insufficient documentation

## 2012-12-31 DIAGNOSIS — R0989 Other specified symptoms and signs involving the circulatory and respiratory systems: Secondary | ICD-10-CM | POA: Insufficient documentation

## 2012-12-31 NOTE — Progress Notes (Signed)
Echocardiogram performed.  

## 2013-01-08 ENCOUNTER — Telehealth: Payer: Self-pay | Admitting: Pulmonary Disease

## 2013-01-08 NOTE — Telephone Encounter (Signed)
Echo 12/31/12 >> mild LVH, EF 65 to 70%, grade 1 diastolic dysfunction, nl RV systolic fx  Results d/w pt.  Her breathing is doing better after course of prednisone.  Advised her to continue with current inhaler regimen.  Will defer CPST for now.    She has ROV on 01/21/13.  Advised her to monitor her symptoms off prednisone.  If continues to do well, then she can cancel ROV on 01/21/13 and reschedule ROV for June 2014.  Will forward message to my nurse to be aware of plan.

## 2013-01-21 ENCOUNTER — Ambulatory Visit (INDEPENDENT_AMBULATORY_CARE_PROVIDER_SITE_OTHER): Payer: BC Managed Care – PPO | Admitting: Pulmonary Disease

## 2013-01-21 ENCOUNTER — Encounter: Payer: Self-pay | Admitting: Pulmonary Disease

## 2013-01-21 VITALS — BP 140/92 | HR 105 | Temp 98.3°F | Ht 61.0 in | Wt 230.4 lb

## 2013-01-21 DIAGNOSIS — J449 Chronic obstructive pulmonary disease, unspecified: Secondary | ICD-10-CM

## 2013-01-21 DIAGNOSIS — I5189 Other ill-defined heart diseases: Secondary | ICD-10-CM

## 2013-01-21 DIAGNOSIS — J4489 Other specified chronic obstructive pulmonary disease: Secondary | ICD-10-CM

## 2013-01-21 DIAGNOSIS — I519 Heart disease, unspecified: Secondary | ICD-10-CM

## 2013-01-21 NOTE — Progress Notes (Signed)
Chief Complaint  Patient presents with  . Follow-up    Pt reports breathing not as bad, still having SOB but slightly improved-- 2D echo done    History of Present Illness: Donna [name] is a 57 y.o. female former smoker with COPD.  Her breathing is doing better.  She is not having cough, wheeze, or sputum.  She denies chest pain.  She will still get winded if she goes to fast.  She uses her albuterol 2 to 3 times per day, and this helps.  She denies sinus congestion or leg swelling.  TESTS: A1AT level 05/20/12 >> 131 (nl 83-199), MM phenotype. RA ONO 06/26/12 >> Test time 8 hrs 18 min.  Mean SpO2 94%, low SpO2 84%.  Spent 16 sec with SpO2 < 88%. PFT 07/07/12 >> FEV1 1.69 (73%), FEV1% 50, TLC 5.33 (112%), DLCO 48%, no BD. Spirometry 12/23/12 >> FEV1 1.20 (51%), FEV1% 62 Echo 12/31/12 >> mild LVH, EF 65 to 70%, grade 1 diastolic dysfunction, nl RV systolic fx  Donna [name]  has a past medical history of Hypertension; Hyperlipidemia; COPD (chronic obstructive pulmonary disease); Bronchitis; Arthritis; Chronic back pain; Diverticulosis; GERD (gastroesophageal reflux disease); Hypothyroidism; Anxiety; and Diabetes mellitus.  Donna [name]  has past surgical history that includes Cesarean section; Hemorrhoid surgery; Cholecystectomy; Colonoscopy; Back surgery; Vaginal hysterectomy; and Tubal ligation.  Prior to Admission medications   Medication Sig Start Date End Date Taking? Authorizing Provider  albuterol (PROVENTIL HFA;VENTOLIN HFA) 108 (90 BASE) MCG/ACT inhaler Inhale 2 puffs into the lungs every 6 (six) hours as needed (cough, wheeze, or chest congestion). Shortness of breath 06/23/12  Yes Coralyn Helling, MD  ALPRAZolam Prudy Feeler) 0.5 MG tablet Take 0.5 mg by mouth at bedtime as needed. Sleep/anxiety.   Yes Historical Provider, MD  budesonide-formoterol (SYMBICORT) 160-4.5 MCG/ACT inhaler Inhale 2 puffs into the lungs 2 (two) times daily. 05/20/12 05/20/13 Yes Coralyn Helling, MD  escitalopram  (LEXAPRO) 20 MG tablet Once a day 02/12/12  Yes Historical Provider, MD  glipiZIDE (GLUCOTROL) 10 MG tablet Take 10 mg by mouth daily.    Yes Historical Provider, MD  levothyroxine (SYNTHROID, LEVOTHROID) 100 MCG tablet Take 100 mcg by mouth daily.   Yes Historical Provider, MD  metFORMIN (GLUCOPHAGE) 1000 MG tablet Take 1,000 mg by mouth 2 (two) times daily with a meal.   Yes Historical Provider, MD  pravastatin (PRAVACHOL) 20 MG tablet Take 20 mg by mouth daily.   Yes Historical Provider, MD  Spacer/Aero-Holding Chambers (AEROCHAMBER MV) inhaler Use as instructed 05/20/12 05/20/13 Yes Coralyn Helling, MD  tiotropium (SPIRIVA) 18 MCG inhalation capsule Place 18 mcg into inhaler and inhale daily.   Yes Historical Provider, MD    Allergies  Allergen Reactions  . Other Other (See Comments)    Steri strip breaks out     Physical Exam:  General - No distress ENT - No sinus tenderness, no oral exudate, no LAN Cardiac - s1s2 regular, no murmur Chest - Diminished breath sounds, no wheeze/rales Back - No focal tenderness Abd - Soft, non-tender Ext - No edema Neuro - Normal strength Skin - No rashes Psych - normal mood, and behavior   Assessment/Plan:  Coralyn Helling, MD [place] [facility]/Critical Care/Sleep Pager:  [phone] 01/21/2013, 2:35 PM

## 2013-01-21 NOTE — Patient Instructions (Signed)
Will arrange for pulmonary rehab referral Follow up in 4 months

## 2013-01-21 NOTE — Assessment & Plan Note (Signed)
She has mild diastolic dysfunction on recent Echo.  I don't think this is enough to cause significant problems with her breathing.  Will monitor.

## 2013-01-21 NOTE — Assessment & Plan Note (Signed)
Her breathing has improved after recent course of prednisone, and she is stable on current inhaler regimen.  Will arrange for pulmonary rehab referral.

## 2013-05-21 ENCOUNTER — Ambulatory Visit (INDEPENDENT_AMBULATORY_CARE_PROVIDER_SITE_OTHER)
Admission: RE | Admit: 2013-05-21 | Discharge: 2013-05-21 | Disposition: A | Discharge status: Home / Self Care | Payer: BC Managed Care – PPO | Source: Ambulatory Visit | Attending: Pulmonary Disease | Admitting: Pulmonary Disease

## 2013-05-21 ENCOUNTER — Ambulatory Visit (INDEPENDENT_AMBULATORY_CARE_PROVIDER_SITE_OTHER): Payer: BC Managed Care – PPO | Admitting: Pulmonary Disease

## 2013-05-21 ENCOUNTER — Encounter: Payer: Self-pay | Admitting: Pulmonary Disease

## 2013-05-21 VITALS — BP 128/74 | HR 105 | Temp 98.0°F | Ht 61.0 in | Wt 232.8 lb

## 2013-05-21 DIAGNOSIS — M89319 Hypertrophy of bone, unspecified shoulder: Secondary | ICD-10-CM

## 2013-05-21 DIAGNOSIS — M948X9 Other specified disorders of cartilage, unspecified sites: Secondary | ICD-10-CM

## 2013-05-21 DIAGNOSIS — J449 Chronic obstructive pulmonary disease, unspecified: Secondary | ICD-10-CM

## 2013-05-21 MED ORDER — FLUTICASONE-SALMETEROL 250-50 MCG/DOSE IN AEPB: 1.0000 | INHALATION_SPRAY | Freq: Two times a day (BID) | RESPIRATORY_TRACT | Status: DC

## 2013-05-21 NOTE — Assessment & Plan Note (Signed)
She has noticed lump on left clavicle head.  Will check xray to further assess.

## 2013-05-21 NOTE — Assessment & Plan Note (Signed)
She felt advair works better than symbicort.  Have given sample of advair, and will send refill.  She has not heard from pulmonary rehab.  Will re-send referral.

## 2013-05-21 NOTE — Progress Notes (Signed)
Chief Complaint  Patient presents with  . Follow-up    Pt reports her breathing is worse d/t the weather. she is using her rescue inhaler 2-3 times daily. Denies any wheezing and chest tx. Has a dry cough at night    History of Present Illness: Donna [name] is a 57 y.o. female former smoker with COPD.  Her breathing is okay except with weather change.  She has to use her albuterol more when it is hot/humid.  She gets a dry cough, and occasional wheeze.  She feels that advair works better than symbicort.  She denies leg swelling.  She has not heard back about pulmonary rehab yet.  She has noticed a lump on her collar bone.  TESTS: A1AT level 05/20/12 >> 131 (nl 83-199), MM phenotype. RA ONO 06/26/12 >> Test time 8 hrs 18 min.  Mean SpO2 94%, low SpO2 84%.  Spent 16 sec with SpO2 < 88%. PFT 07/07/12 >> FEV1 1.69 (73%), FEV1% 50, TLC 5.33 (112%), DLCO 48%, no BD. Spirometry 12/23/12 >> FEV1 1.20 (51%), FEV1% 62 Echo 12/31/12 >> mild LVH, EF 65 to 70%, grade 1 diastolic dysfunction, nl RV systolic fx  Donna [name]  has a past medical history of Hypertension; Hyperlipidemia; COPD (chronic obstructive pulmonary disease); Bronchitis; Arthritis; Chronic back pain; Diverticulosis; GERD (gastroesophageal reflux disease); Hypothyroidism; Anxiety; and Diabetes mellitus.  Donna [name]  has past surgical history that includes Cesarean section; Hemorrhoid surgery; Cholecystectomy; Colonoscopy; Back surgery; Vaginal hysterectomy; and Tubal ligation.  Prior to Admission medications   Medication Sig Start Date End Date Taking? Authorizing Provider  albuterol (PROVENTIL HFA;VENTOLIN HFA) 108 (90 BASE) MCG/ACT inhaler Inhale 2 puffs into the lungs every 6 (six) hours as needed (cough, wheeze, or chest congestion). Shortness of breath 06/23/12  Yes Coralyn Helling, MD  ALPRAZolam Prudy Feeler) 0.5 MG tablet Take 0.5 mg by mouth at bedtime as needed. Sleep/anxiety.   Yes Historical Provider, MD  budesonide-formoterol  (SYMBICORT) 160-4.5 MCG/ACT inhaler Inhale 2 puffs into the lungs 2 (two) times daily. 05/20/12 05/20/13 Yes Coralyn Helling, MD  escitalopram (LEXAPRO) 20 MG tablet Once a day 02/12/12  Yes Historical Provider, MD  glipiZIDE (GLUCOTROL) 10 MG tablet Take 10 mg by mouth daily.    Yes Historical Provider, MD  levothyroxine (SYNTHROID, LEVOTHROID) 100 MCG tablet Take 100 mcg by mouth daily.   Yes Historical Provider, MD  metFORMIN (GLUCOPHAGE) 1000 MG tablet Take 1,000 mg by mouth 2 (two) times daily with a meal.   Yes Historical Provider, MD  pravastatin (PRAVACHOL) 20 MG tablet Take 20 mg by mouth daily.   Yes Historical Provider, MD  Spacer/Aero-Holding Chambers (AEROCHAMBER MV) inhaler Use as instructed 05/20/12 05/20/13 Yes Coralyn Helling, MD  tiotropium (SPIRIVA) 18 MCG inhalation capsule Place 18 mcg into inhaler and inhale daily.   Yes Historical Provider, MD    Allergies  Allergen Reactions  . Other Other (See Comments)    Steri strip breaks out     Physical Exam:  General - No distress ENT - No sinus tenderness, no oral exudate, no LAN Cardiac - s1s2 regular, no murmur Chest - Diminished breath sounds, no wheeze/rales, Lt clavicle head more prominent than Rt >> non-tender Back - No focal tenderness Abd - Soft, non-tender Ext - No edema Neuro - Normal strength Skin - No rashes Psych - normal mood, and behavior   Assessment/Plan:  Coralyn Helling, MD [place] [facility]/Critical Care/Sleep Pager:  [phone] 05/21/2013, 2:05 PM

## 2013-05-21 NOTE — Patient Instructions (Signed)
Xray today >> will call with results Stop symbicort Advair one puff twice per day, and rinse mouth after each use Follow up in 6 months

## 2013-05-24 ENCOUNTER — Telehealth: Payer: Self-pay | Admitting: Pulmonary Disease

## 2013-05-24 NOTE — Telephone Encounter (Signed)
Pt is aware of results. 

## 2013-05-24 NOTE — Telephone Encounter (Signed)
lmtcb

## 2013-05-24 NOTE — Telephone Encounter (Signed)
Dg Clavicle Left  05/21/2013   *RADIOLOGY REPORT*  Clinical Data: Palpable knot on head of left clavicle.  No injury and no pain.  LEFT CLAVICLE - 2+ VIEWS  Comparison: None.  Findings: There is no fracture.  There is no bone lesion.  The sternoclavicular and AC joints are normally spaced and aligned.  No degenerative changes are seen.  The glenohumeral joint is normally spaced and aligned.  The soft tissues are unremarkable.  No mass or soft tissue protrusion is seen to account for the palpable abnormality.  IMPRESSION: Normal left clavicle radiographs.   Original Report Authenticated By: Amie Portland, M.D.   Will have my nurse inform pt that collar bone xray was normal.

## 2013-05-25 ENCOUNTER — Other Ambulatory Visit: Payer: Self-pay | Admitting: Nurse Practitioner

## 2013-05-25 DIAGNOSIS — Z780 Asymptomatic menopausal state: Secondary | ICD-10-CM

## 2013-06-11 ENCOUNTER — Other Ambulatory Visit: Payer: BC Managed Care – PPO

## 2013-06-11 ENCOUNTER — Ambulatory Visit: Payer: BC Managed Care – PPO

## 2013-07-30 ENCOUNTER — Other Ambulatory Visit: Payer: Self-pay | Admitting: Pulmonary Disease

## 2013-08-04 ENCOUNTER — Other Ambulatory Visit: Payer: Self-pay | Admitting: Pulmonary Disease

## 2013-08-04 NOTE — Telephone Encounter (Signed)
Changed back to Advair per 7.25.14 ov w/ VS.

## 2013-08-12 ENCOUNTER — Ambulatory Visit
Admission: RE | Admit: 2013-08-12 | Discharge: 2013-08-12 | Disposition: A | Discharge status: Home / Self Care | Payer: BC Managed Care – PPO | Source: Ambulatory Visit | Attending: Nurse Practitioner | Admitting: Nurse Practitioner

## 2013-08-12 DIAGNOSIS — Z780 Asymptomatic menopausal state: Secondary | ICD-10-CM

## 2013-08-29 IMAGING — RF DG MYELOGRAM 2+ REGIONS
12 of 24 series · 12 of 24 positions shown · non-contrast
Comparison: None.
COMPARISON: None.

MYELOGRAM  INJECTION
TECHNIQUE: Informed consent was obtained from the patient prior to
the procedure, including potential complications of headache,
allergy, infection and pain. Specific instructions were given
regarding 24 hour bedrest post procedure to prevent post-LP
headache.  A timeout procedure was performed.  With the patient
prone, the lower back was prepped with Betadine.  1% Lidocaine was
used for local anesthesia.  Lumbar puncture was performed by the
radiologist at the L3-4 level using a 22 gauge needle with return
of clear CSF.  10 ml of Nmnipaque-4HH was injected into the
subarachnoid space .
CLINICAL DATA: Low back pain.  Mid back pain.  Left leg weakness.
TECHNIQUE: Multidetector CT imaging of the lumbar spine was
performed following myelography.  Multiplanar CT image
reconstructions were also generated.
TECHNIQUE: CT imaging of the thoracic spine was performed after
intrathecal contrast administration.  Multiplanar CT image

[Series 3: myelogram  white · 1 of 1 slices shown (1 of 12)]
[im 1/1]
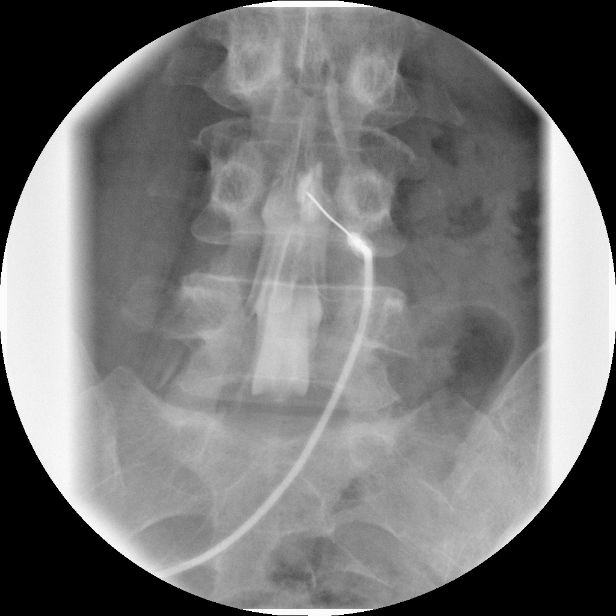

[Series 5: myelogram  white · 1 of 1 slices shown (2 of 12)]
[im 1/1]
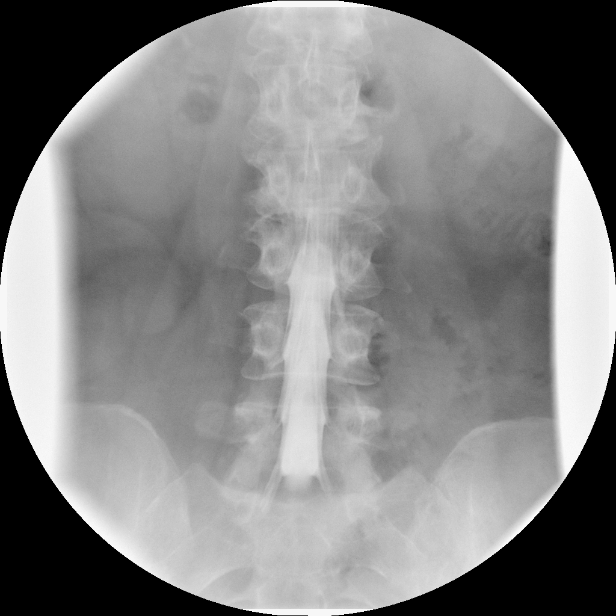

[Series 7: myelogram  white · 1 of 1 slices shown (3 of 12)]
[im 1/1]
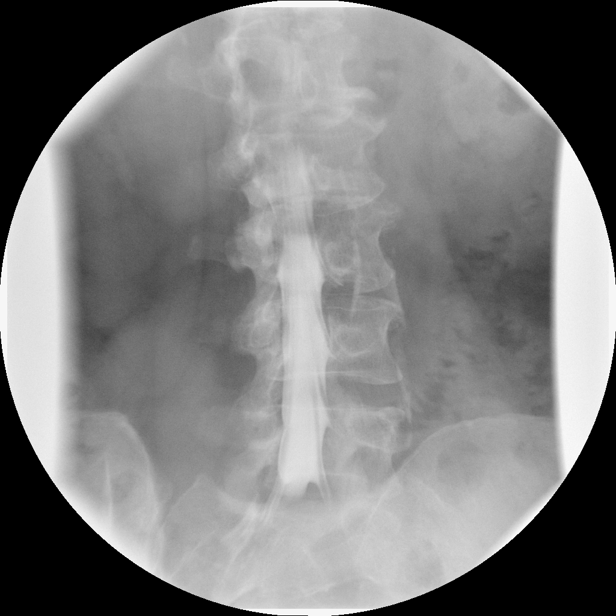

[Series 9: myelogram  white · 1 of 1 slices shown (4 of 12)]
[im 1/1]
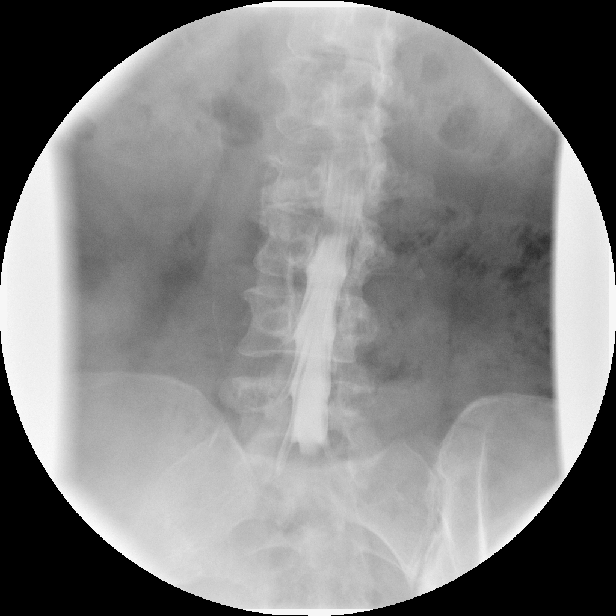

[Series 11: myelogram  white · 1 of 1 slices shown (5 of 12)]
[im 1/1]
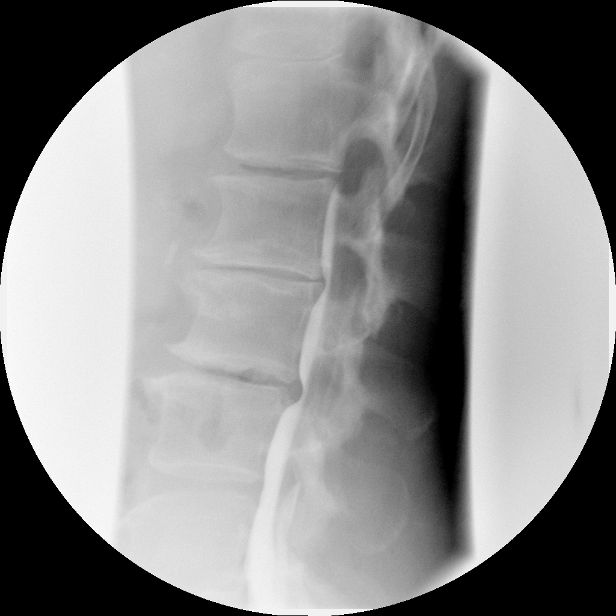

[Series 13: myelogram  white · 1 of 1 slices shown (6 of 12)]
[im 1/1]
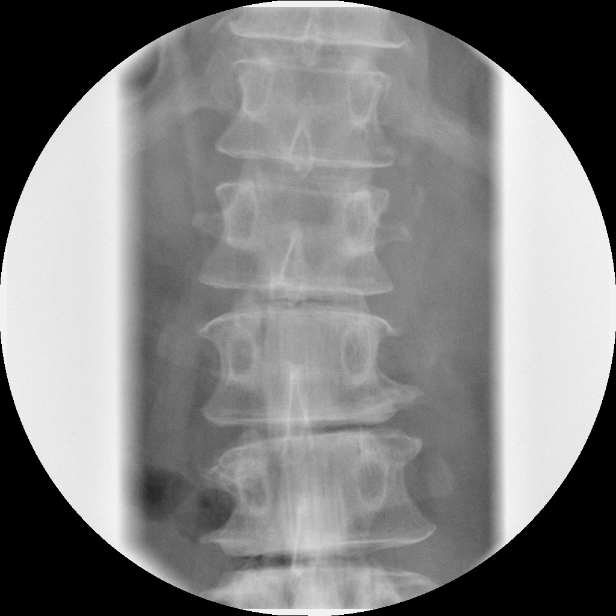

[Series 15: myelogram  white · 1 of 1 slices shown (7 of 12)]
[im 1/1]
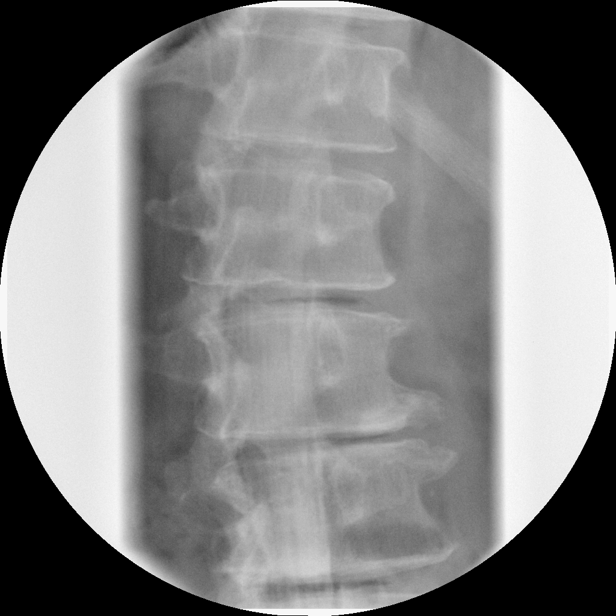

[Series 17: myelogram  white · 1 of 1 slices shown (8 of 12)]
[im 1/1]
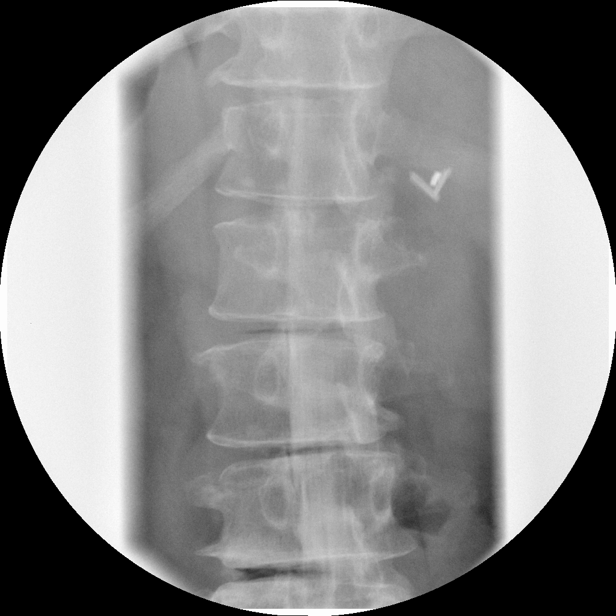

[Series 19: myelogram  white · 1 of 1 slices shown (9 of 12)]
[im 1/1]
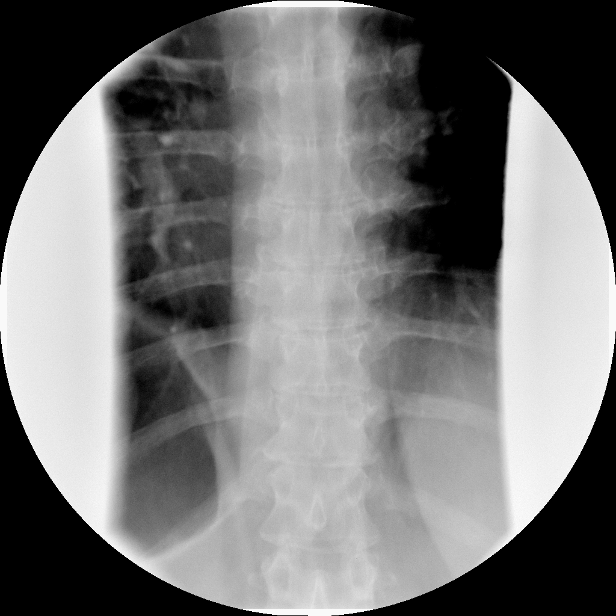

[Series 21: myelogram  white · 1 of 1 slices shown (10 of 12)]
[im 1/1]
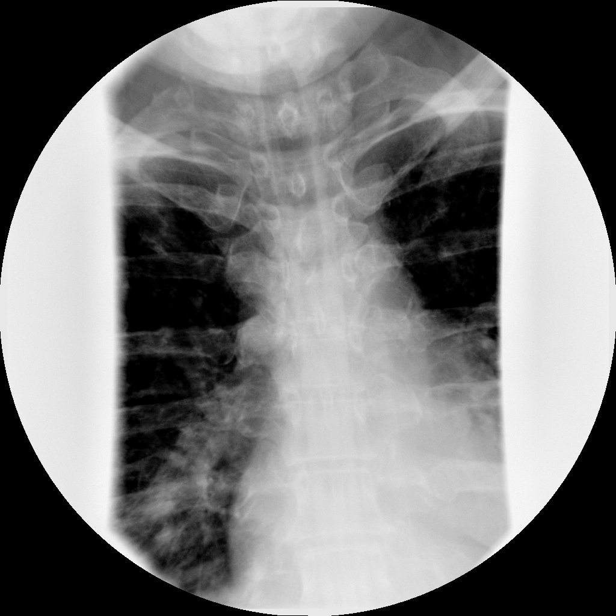

[Series 23: myelogram  white · 1 of 1 slices shown (11 of 12)]
[im 1/1]
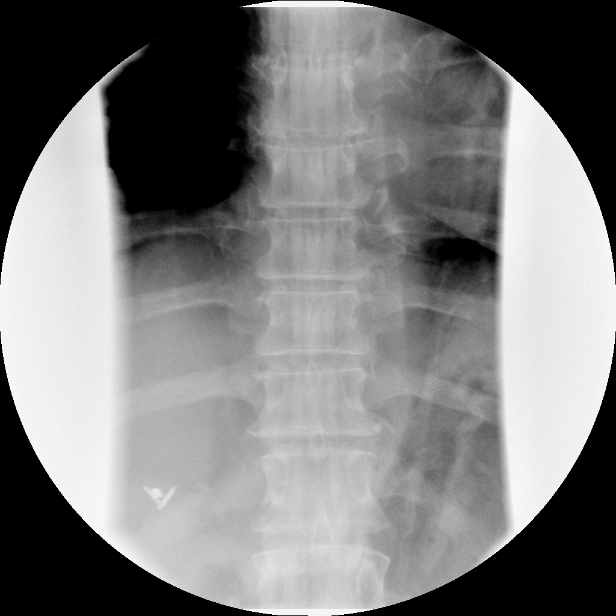

[Series 25: myelogram  white · 1 of 1 slices shown (12 of 12)]
[im 1/1]
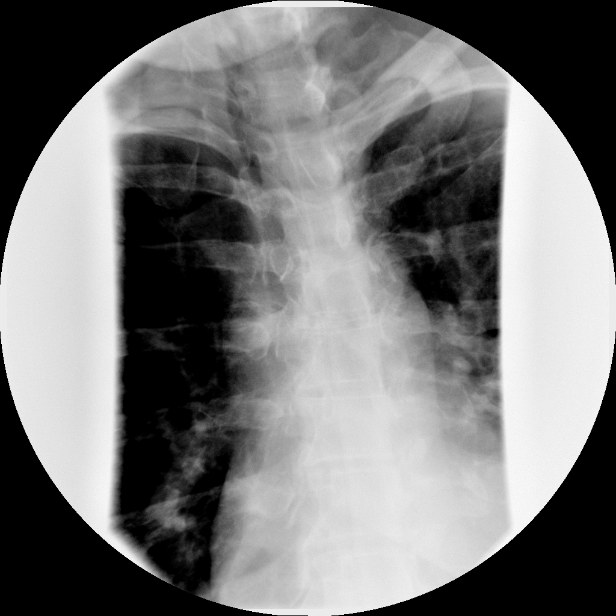

[12 of 24 positions shown; findings below may reference images not displayed]

IMPRESSION: Successful injection of  intrathecal contrast for myelography.

MYELOGRAM LUMBAR
FINDINGS: Good opacification lumbar subarachnoid space.  Moderate
stenosis L2-L3 secondary to a soft tissue ventral defect there is
disc space narrowing at this level with anterior osteophyte
formation.  Similar disc space narrowing L1-L2 is associated with
less impressive ventral defect.

Anatomic alignment is present except for 2 mm retrolisthesis L2 on
L3 with the patient standing in neutral.  In extension, this
increases to 3 mm, but in flexion remains at 2 mm.

There is slight endplate deformity at L2 superiorly likely
representing Schmorl's node.  Slight sclerosis of the superior
inferior endplates reflects degenerative disc disease.

Fluoroscopy Time: 3.28 minutes
IMPRESSION: As above.

CT MYELOGRAPHY LUMBAR SPINE
FINDINGS: There are no prevertebral or paraspinous masses. There
is moderate vascular calcification without aneurysmal dilatation.

L1-2: Asymmetric loss of interspace height on the right is
associated with a foraminal and extraforaminal protrusion and
moderate spurring.  No definite L2 nerve root encroachment canal,
but right L1 nerve root encroachment is possible the foramen.

L2-3: 2 mm retrolisthesis with the patient recumbent for CT.
Asymmetric loss of interspace height on the left is accompanied by
marked vacuum phenomenon, central and leftward protrusion,
extraforaminal disc material and spurring.  Mild bilateral facet
arthropathy.  Left L2 and left L3 nerve root encroachment is noted
along with mild to moderate central canal stenosis.

L3-4: Mild bulge.  Mild facet arthropathy.  No compressive lesion.

L4-5: Mild bulge.  Mild facet arthropathy.  No compressive lesion.

L5-S1: There is a mild bulge.  Mild facet arthropathy.  No
compressive lesion.

Please see findings of thoracic spine dictation regarding an extra
vertebral body.  This has been labeled T 13.
IMPRESSION: The dominant left-sided abnormality is at L2-L3, where central and
leftward protrusion, asymmetric left-sided loss of interspace
height, extraforaminal spurring and disc material, as well as 2 mm
of facet mediated retrolisthesis results in left L2 and left L3
neural encroachment, as well as mild to moderate central canal
stenosis.

More mild disc pathology L1-2, predominates on the right.

MYELOGRAM THORACIC
FINDINGS: There is dextro thoracic or lumbar vertebrae depending on
how one indicates the spine.  To avoid confusion from the
convention of counting up from the bottom, I have labeled this
vertebral body T13.  It does not display any ribs however.

There is good opacification of the thoracic subarachnoid space.
Shallow ventral defects are seen at T8-9, T11-12, and T 13 - L1.
No cord compression is seen.
IMPRESSION: As above.

CT MYELOGRAPHY THORACIC SPINE
FINDINGS: The individual disc spaces are examined as follows:

 T1-2: Normal.

T2-3:  Normal.

T3-4:  Normal.

T4-5:  Normal.

T5-6:  Normal.

T6-7:  Shallow bulge to the left.  No neural compression.

T7-8:  Shallow central bulge.  No neural compression.

T8-9:  Shallow central and rightward protrusion.  Slight effacement
anterior subarachnoid space.  No cord compression.

T9-10:  Mild facet arthropathy.  No neural compression.

T10-11:  Mild facet arthropathy and ligamentum flavum
calcification.  No neural compression.

T11-12:  Mild facet arthropathy.  Shallow central protrusion.
Borderline canal stenosis.  No cord compression.

T12-13: Mild to moderate central protrusion.  Slight effacement
anterior subarachnoid space without compression of the distal
conus.  No neural compression.  Mild facet arthropathy.
IMPRESSION: Transitional anatomy.  Please see comments above. An
extra vertebral body has been labeled T13.

Multilevel spondylosis as described.  No significant spinal
stenosis or cord compression.

## 2013-09-12 IMAGING — CR DG CHEST 2V
2 series · 2 of 2 positions shown · non-contrast
Comparison: None.

CLINICAL DATA: Preoperative respiratory exam; lumbar HNP, COPD and
previous smoker

CHEST - 2 VIEW

[view not recorded (1 of 2)]
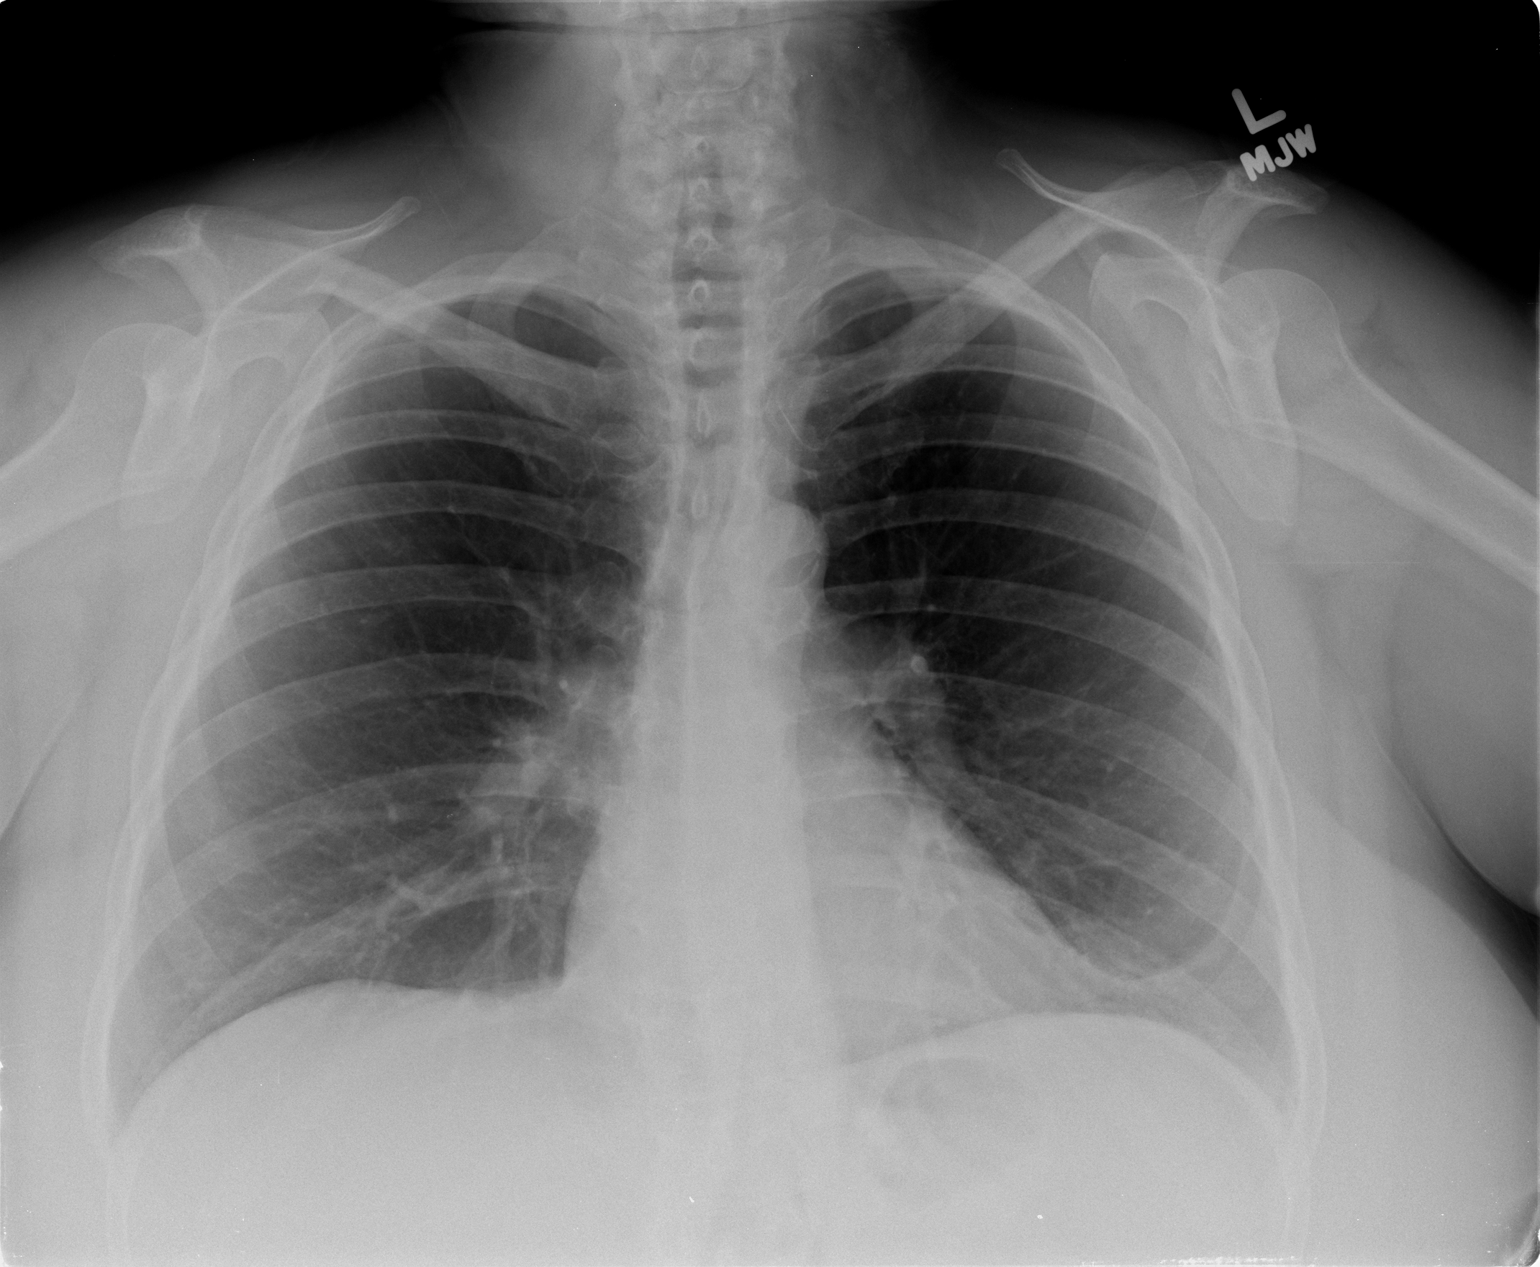

[view not recorded (2 of 2)]
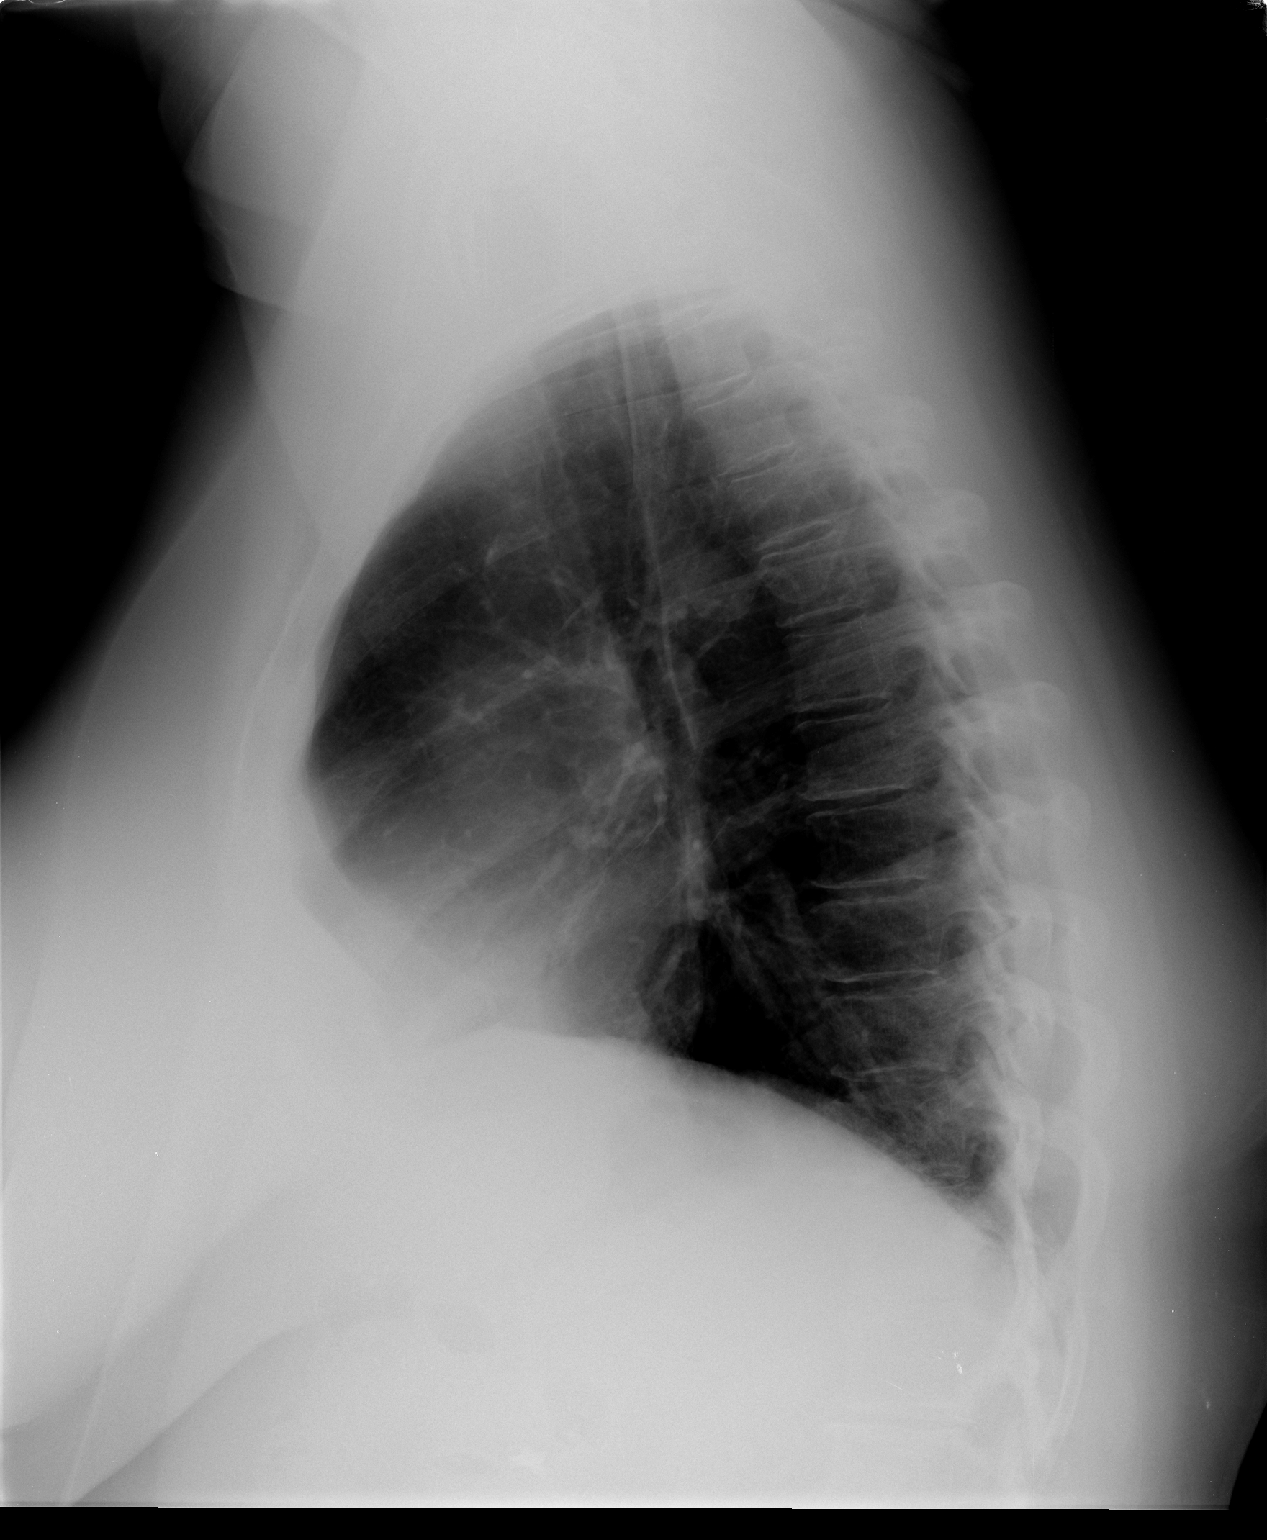

[2 of 2 positions shown; findings below may reference images not displayed]

FINDINGS: The cardiac silhouette, mediastinum, pulmonary
vasculature are within normal limits.  Both lungs are clear.
There is no acute bony abnormality.
IMPRESSION: There is no evidence of acute cardiac or pulmonary process.

## 2013-09-20 ENCOUNTER — Telehealth (HOSPITAL_COMMUNITY): Payer: Self-pay | Admitting: *Deleted

## 2013-10-25 ENCOUNTER — Telehealth (HOSPITAL_COMMUNITY): Payer: Self-pay | Admitting: *Deleted

## 2013-12-06 ENCOUNTER — Other Ambulatory Visit: Payer: Self-pay | Admitting: Pulmonary Disease

## 2013-12-31 ENCOUNTER — Telehealth: Payer: Self-pay | Admitting: Pulmonary Disease

## 2014-01-03 NOTE — Telephone Encounter (Signed)
Form received.  Will place for Dr Silverio LaySoods signature

## 2014-01-03 NOTE — Telephone Encounter (Signed)
Pt has not followed up since July 2014.  She will need to have f/u first before forms can be completed.

## 2014-01-03 NOTE — Telephone Encounter (Addendum)
I have contacted the pt. ROV has been scheduled for 01/19/14 at 3:30pm. These forms have been placed in Dr. Evlyn CourierSood's look at cubby on side A (they are in a green folder). Once completed they will need to be sent back down to [employer].  I have left a message with [employer] that these forms will be completed at [facility].

## 2014-01-19 ENCOUNTER — Encounter: Payer: Self-pay | Admitting: Pulmonary Disease

## 2014-01-19 ENCOUNTER — Ambulatory Visit (INDEPENDENT_AMBULATORY_CARE_PROVIDER_SITE_OTHER): Payer: BC Managed Care – PPO | Admitting: Pulmonary Disease

## 2014-01-19 VITALS — BP 134/88 | HR 90 | Temp 97.2°F | Ht 62.0 in | Wt 228.4 lb

## 2014-01-19 DIAGNOSIS — B370 Candidal stomatitis: Secondary | ICD-10-CM | POA: Insufficient documentation

## 2014-01-19 DIAGNOSIS — J449 Chronic obstructive pulmonary disease, unspecified: Secondary | ICD-10-CM

## 2014-01-19 MED ORDER — NYSTATIN 100000 UNIT/ML MT SUSP: 500000.0000 [IU] | Freq: Four times a day (QID) | OROMUCOSAL | Status: DC

## 2014-01-19 NOTE — Assessment & Plan Note (Addendum)
Mild flare related to seasonal changes.  She can use albuterol as needed.  She is to continue spiriva and advair.  I have completed her insurance forms.

## 2014-01-19 NOTE — Patient Instructions (Signed)
Follow up in 6 months 

## 2014-01-19 NOTE — Assessment & Plan Note (Signed)
Will have her use nystatin swish and swallow.  Advised her to rinse mouth after using advair.

## 2014-01-19 NOTE — Progress Notes (Signed)
Chief Complaint  Patient presents with  . Follow-up    Pt states she has heard wheezing x 2 days. Pt has lost hearing in right ear x 1 month, pt was given abx from  PCP and didnt help. SOB but no more than normal. Denies CP.    History of Present Illness: Donna [name] is a 58 y.o. female former smoker with COPD.  She has noticed more wheezing for the past two days with weather change.  She gets runny nose sometime.  She gets hoarseness, and soreness with swallowing.  She denies sinus pain or fever.  She was told she had a rt ear infection and was given zithromax.  She still has trouble hearing from her right ear.  TESTS: A1AT level 05/20/12 >> 131 (nl 83-199), MM phenotype. RA ONO 06/26/12 >> Test time 8 hrs 18 min.  Mean SpO2 94%, low SpO2 84%.  Spent 16 sec with SpO2 < 88%. PFT 07/07/12 >> FEV1 1.69 (73%), FEV1% 50, TLC 5.33 (112%), DLCO 48%, no BD. Spirometry 12/23/12 >> FEV1 1.20 (51%), FEV1% 62 Echo 12/31/12 >> mild LVH, EF 65 to 70%, grade 1 diastolic dysfunction, nl RV systolic fx  Donna [name]  has a past medical history of Hypertension; Hyperlipidemia; COPD (chronic obstructive pulmonary disease); Bronchitis; Arthritis; Chronic back pain; Diverticulosis; GERD (gastroesophageal reflux disease); Hypothyroidism; Anxiety; and Diabetes mellitus.  Donna HaberConnie M [name]  has past surgical history that includes Cesarean section; Hemorrhoid surgery; Cholecystectomy; Colonoscopy; Back surgery; Vaginal hysterectomy; and Tubal ligation.  Prior to Admission medications   Medication Sig Start Date End Date Taking? Authorizing Provider  albuterol (PROVENTIL HFA;VENTOLIN HFA) 108 (90 BASE) MCG/ACT inhaler Inhale 2 puffs into the lungs every 6 (six) hours as needed (cough, wheeze, or chest congestion). Shortness of breath 06/23/12  Yes Coralyn HellingVineet Jolanta Cabeza, MD  ALPRAZolam Prudy Feeler(XANAX) 0.5 MG tablet Take 0.5 mg by mouth at bedtime as needed. Sleep/anxiety.   Yes Historical Provider, MD  budesonide-formoterol  (SYMBICORT) 160-4.5 MCG/ACT inhaler Inhale 2 puffs into the lungs 2 (two) times daily. 05/20/12 05/20/13 Yes Coralyn HellingVineet Ever Halberg, MD  escitalopram (LEXAPRO) 20 MG tablet Once a day 02/12/12  Yes Historical Provider, MD  glipiZIDE (GLUCOTROL) 10 MG tablet Take 10 mg by mouth daily.    Yes Historical Provider, MD  levothyroxine (SYNTHROID, LEVOTHROID) 100 MCG tablet Take 100 mcg by mouth daily.   Yes Historical Provider, MD  metFORMIN (GLUCOPHAGE) 1000 MG tablet Take 1,000 mg by mouth 2 (two) times daily with a meal.   Yes Historical Provider, MD  pravastatin (PRAVACHOL) 20 MG tablet Take 20 mg by mouth daily.   Yes Historical Provider, MD  Spacer/Aero-Holding Chambers (AEROCHAMBER MV) inhaler Use as instructed 05/20/12 05/20/13 Yes Coralyn HellingVineet Anshika Pethtel, MD  tiotropium (SPIRIVA) 18 MCG inhalation capsule Place 18 mcg into inhaler and inhale daily.   Yes Historical Provider, MD    Allergies  Allergen Reactions  . Other Other (See Comments)    Steri strip breaks out     Physical Exam:  General - No distress ENT - No sinus tenderness, no oral exudate, no LAN, TM clear, mild white exudate posterior pharynx Cardiac - s1s2 regular, no murmur Chest - Diminished breath sounds, no wheeze/rales Back - No focal tenderness Abd - Soft, non-tender Ext - No edema Neuro - Normal strength Skin - No rashes Psych - normal mood, and behavior   Assessment/Plan:  Coralyn HellingVineet Jayvin Hurrell, MD [place] [facility]/Sleep Pager:  [phone] 01/19/2014, 3:34 PM

## 2014-01-25 NOTE — Telephone Encounter (Signed)
Do you know if this was completed at [facility] on 01-19-14? Can we sign off on the message? Thanks!  Carron CurieJennifer Jahmir Salo, CMA

## 2014-01-25 NOTE — Telephone Encounter (Signed)
Called and spoke with Donna [name]. She reports she did receive form back.

## 2014-01-25 NOTE — Telephone Encounter (Signed)
I was not here on this day. Might want to check with the person who worked with him that day. This folder is no longer in Dr. Evlyn CourierSood's box, so I would assume this has been done.

## 2014-06-12 IMAGING — CR DG CHEST 2V
2 series · 2 of 2 positions shown · non-contrast
Comparison: 03/25/2012

CLINICAL DATA: Progressive dyspnea.  History of COPD.

CHEST - 2 VIEW

[view not recorded (1 of 2)]
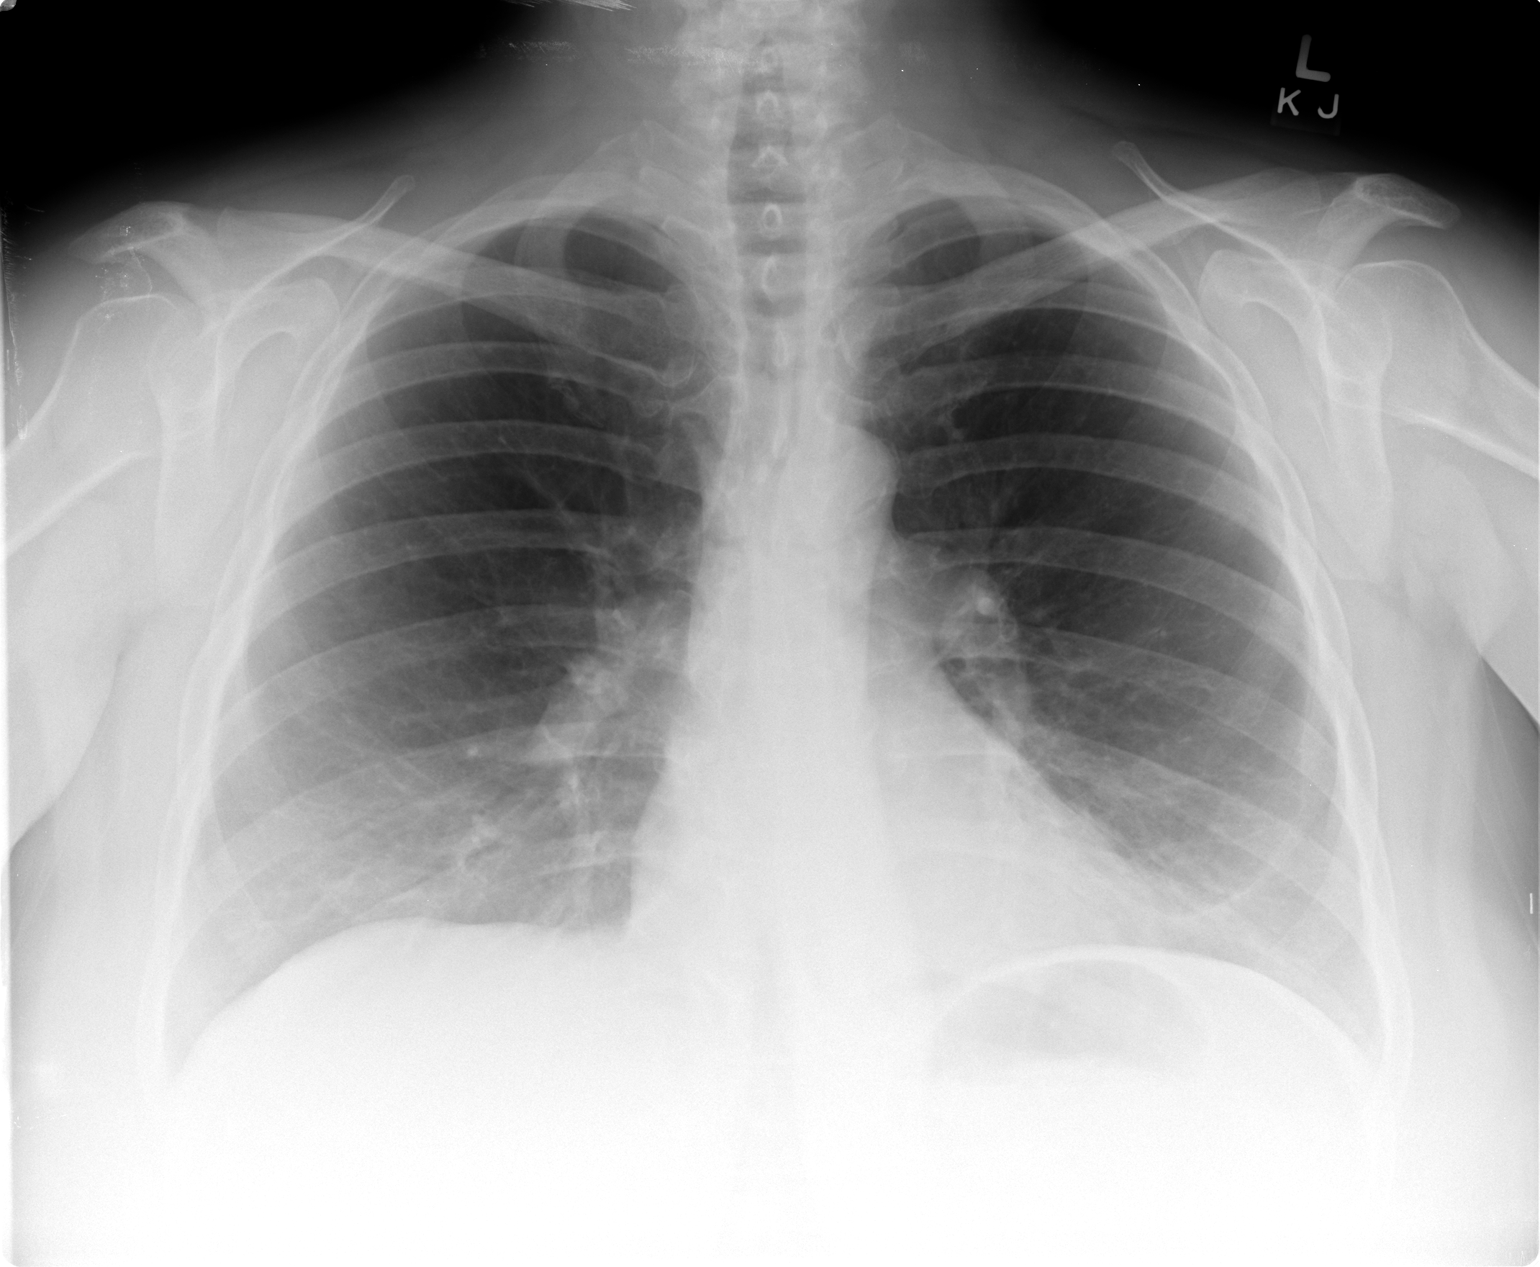

[view not recorded (2 of 2)]
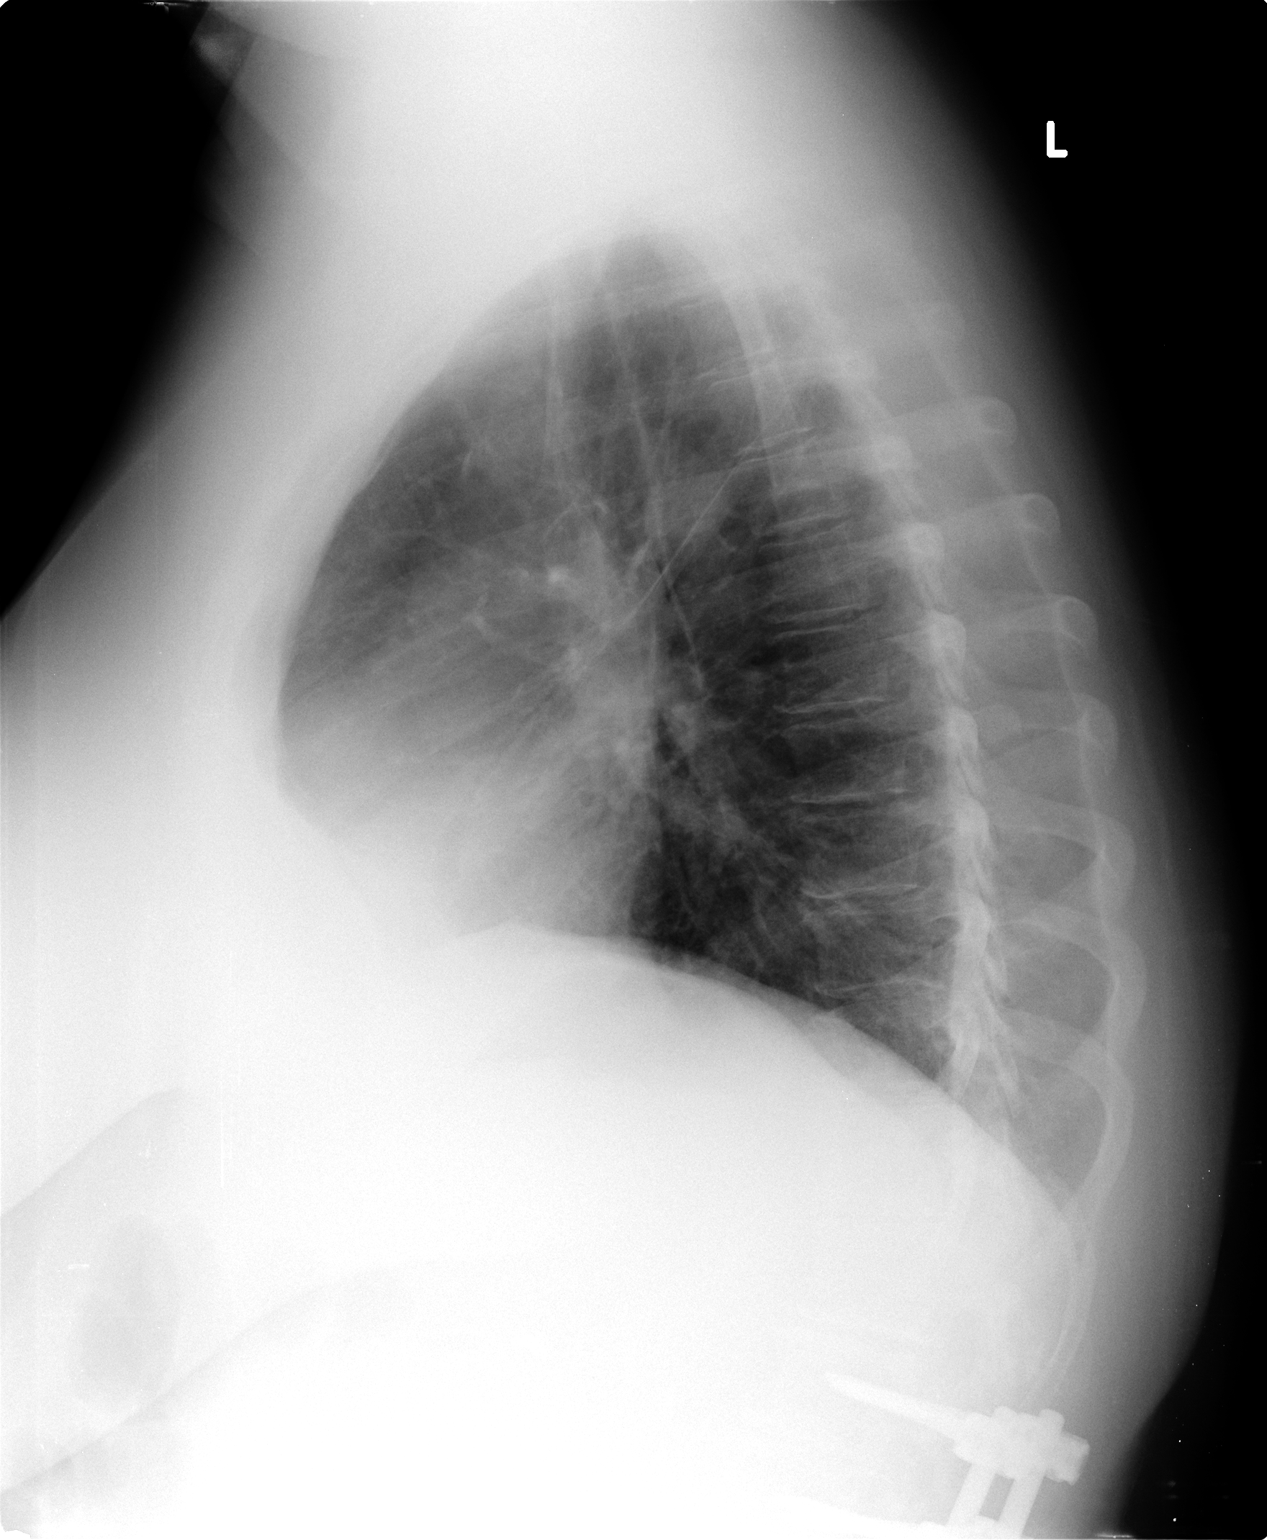

[2 of 2 positions shown; findings below may reference images not displayed]

FINDINGS: Normal heart size.

No pleural effusion or edema.

There is no airspace consolidation noted.

Scar like density noted in the left base.
IMPRESSION: 1.  No acute cardiopulmonary abnormalities.

## 2014-06-16 ENCOUNTER — Emergency Department (HOSPITAL_COMMUNITY): Payer: 59

## 2014-06-16 ENCOUNTER — Inpatient Hospital Stay (HOSPITAL_COMMUNITY): Payer: 59

## 2014-06-16 ENCOUNTER — Encounter (HOSPITAL_COMMUNITY): Payer: Self-pay | Admitting: Emergency Medicine

## 2014-06-16 ENCOUNTER — Inpatient Hospital Stay (HOSPITAL_COMMUNITY)
Admission: EM | Admit: 2014-06-16 | Discharge: 2014-06-21 | DRG: 077 | Disposition: A | Discharge status: Home / Self Care | Payer: 59 | Attending: Internal Medicine | Admitting: Internal Medicine

## 2014-06-16 DIAGNOSIS — E785 Hyperlipidemia, unspecified: Secondary | ICD-10-CM | POA: Diagnosis present

## 2014-06-16 DIAGNOSIS — I5189 Other ill-defined heart diseases: Secondary | ICD-10-CM

## 2014-06-16 DIAGNOSIS — Z87891 Personal history of nicotine dependence: Secondary | ICD-10-CM | POA: Diagnosis not present

## 2014-06-16 DIAGNOSIS — Z801 Family history of malignant neoplasm of trachea, bronchus and lung: Secondary | ICD-10-CM | POA: Diagnosis not present

## 2014-06-16 DIAGNOSIS — F411 Generalized anxiety disorder: Secondary | ICD-10-CM | POA: Diagnosis present

## 2014-06-16 DIAGNOSIS — E119 Type 2 diabetes mellitus without complications: Secondary | ICD-10-CM | POA: Diagnosis present

## 2014-06-16 DIAGNOSIS — I674 Hypertensive encephalopathy: Secondary | ICD-10-CM | POA: Diagnosis present

## 2014-06-16 DIAGNOSIS — E872 Acidosis, unspecified: Secondary | ICD-10-CM | POA: Diagnosis present

## 2014-06-16 DIAGNOSIS — M479 Spondylosis, unspecified: Secondary | ICD-10-CM | POA: Diagnosis present

## 2014-06-16 DIAGNOSIS — G40901 Epilepsy, unspecified, not intractable, with status epilepticus: Secondary | ICD-10-CM

## 2014-06-16 DIAGNOSIS — J9602 Acute respiratory failure with hypercapnia: Secondary | ICD-10-CM

## 2014-06-16 DIAGNOSIS — J4489 Other specified chronic obstructive pulmonary disease: Secondary | ICD-10-CM | POA: Diagnosis present

## 2014-06-16 DIAGNOSIS — D649 Anemia, unspecified: Secondary | ICD-10-CM | POA: Diagnosis present

## 2014-06-16 DIAGNOSIS — K219 Gastro-esophageal reflux disease without esophagitis: Secondary | ICD-10-CM | POA: Diagnosis present

## 2014-06-16 DIAGNOSIS — G039 Meningitis, unspecified: Secondary | ICD-10-CM

## 2014-06-16 DIAGNOSIS — E876 Hypokalemia: Secondary | ICD-10-CM | POA: Diagnosis present

## 2014-06-16 DIAGNOSIS — R569 Unspecified convulsions: Secondary | ICD-10-CM | POA: Diagnosis present

## 2014-06-16 DIAGNOSIS — E039 Hypothyroidism, unspecified: Secondary | ICD-10-CM | POA: Diagnosis present

## 2014-06-16 DIAGNOSIS — J42 Unspecified chronic bronchitis: Secondary | ICD-10-CM

## 2014-06-16 DIAGNOSIS — J438 Other emphysema: Secondary | ICD-10-CM

## 2014-06-16 DIAGNOSIS — B370 Candidal stomatitis: Secondary | ICD-10-CM

## 2014-06-16 DIAGNOSIS — G894 Chronic pain syndrome: Secondary | ICD-10-CM | POA: Diagnosis present

## 2014-06-16 DIAGNOSIS — Z8249 Family history of ischemic heart disease and other diseases of the circulatory system: Secondary | ICD-10-CM

## 2014-06-16 DIAGNOSIS — J449 Chronic obstructive pulmonary disease, unspecified: Secondary | ICD-10-CM

## 2014-06-16 DIAGNOSIS — J9600 Acute respiratory failure, unspecified whether with hypoxia or hypercapnia: Secondary | ICD-10-CM | POA: Diagnosis present

## 2014-06-16 DIAGNOSIS — G0491 Myelitis, unspecified: Secondary | ICD-10-CM

## 2014-06-16 DIAGNOSIS — G0490 Encephalitis and encephalomyelitis, unspecified: Secondary | ICD-10-CM

## 2014-06-16 DIAGNOSIS — R509 Fever, unspecified: Secondary | ICD-10-CM

## 2014-06-16 DIAGNOSIS — G9340 Encephalopathy, unspecified: Secondary | ICD-10-CM

## 2014-06-16 LAB — URINE MICROSCOPIC-ADD ON

## 2014-06-16 LAB — COMPREHENSIVE METABOLIC PANEL
ALBUMIN: 4.3 g/dL (ref 3.5–5.2)
ALT: 33 U/L (ref 0–35)
AST: 44 U/L — ABNORMAL HIGH (ref 0–37)
Alkaline Phosphatase: 96 U/L (ref 39–117)
Anion gap: 21 — ABNORMAL HIGH (ref 5–15)
BUN: 14 mg/dL (ref 6–23)
CALCIUM: 10.1 mg/dL (ref 8.4–10.5)
CO2: 22 mEq/L (ref 19–32)
CREATININE: 1.02 mg/dL (ref 0.50–1.10)
Chloride: 97 mEq/L (ref 96–112)
GFR calc Af Amer: 70 mL/min — ABNORMAL LOW (ref >90)
GFR, EST NON AFRICAN AMERICAN: 60 mL/min — AB (ref >90)
Glucose, Bld: 199 mg/dL — ABNORMAL HIGH (ref 70–99)
Potassium: 4.4 mEq/L (ref 3.7–5.3)
Sodium: 140 mEq/L (ref 137–147)
TOTAL PROTEIN: 7.8 g/dL (ref 6.0–8.3)
Total Bilirubin: 0.3 mg/dL (ref 0.3–1.2)

## 2014-06-16 LAB — I-STAT VENOUS BLOOD GAS, ED
Bicarbonate: 26.5 mEq/L — ABNORMAL HIGH (ref 20.0–24.0)
O2 Saturation: 26 %
TCO2: 28 mmol/L (ref 0–100)
pCO2, Ven: 51.9 mmHg — ABNORMAL HIGH (ref 45.0–50.0)
pH, Ven: 7.316 — ABNORMAL HIGH (ref 7.250–7.300)
pO2, Ven: 19 mmHg — CL (ref 30.0–45.0)

## 2014-06-16 LAB — I-STAT CHEM 8, ED
BUN: 14 mg/dL (ref 6–23)
CALCIUM ION: 1.12 mmol/L (ref 1.12–1.23)
CHLORIDE: 99 meq/L (ref 96–112)
Creatinine, Ser: 1 mg/dL (ref 0.50–1.10)
GLUCOSE: 202 mg/dL — AB (ref 70–99)
HEMATOCRIT: 43 % (ref 36.0–46.0)
Hemoglobin: 14.6 g/dL (ref 12.0–15.0)
Potassium: 4.2 mEq/L (ref 3.7–5.3)
Sodium: 137 mEq/L (ref 137–147)
TCO2: 21 mmol/L (ref 0–100)

## 2014-06-16 LAB — CSF CELL COUNT WITH DIFFERENTIAL
Eosinophils, CSF: NONE SEEN % (ref 0–1)
Eosinophils, CSF: NONE SEEN % (ref 0–1)
RBC COUNT CSF: 3485 /mm3 — AB
RBC COUNT CSF: 91 /mm3 — AB
TUBE #: 1
Tube #: 3
WBC, CSF: 1 /mm3 (ref 0–5)
WBC, CSF: 3 /mm3 (ref 0–5)

## 2014-06-16 LAB — BLOOD GAS, ARTERIAL
Acid-base deficit: 0.9 mmol/L (ref 0.0–2.0)
Bicarbonate: 22.9 mEq/L (ref 20.0–24.0)
DRAWN BY: 41881
FIO2: 0.4 %
LHR: 14 {breaths}/min
MECHVT: 500 mL
O2 Saturation: 97.8 %
PCO2 ART: 35.8 mmHg (ref 35.0–45.0)
PEEP: 5 cmH2O
Patient temperature: 98.6
TCO2: 24 mmol/L (ref 0–100)
pH, Arterial: 7.422 (ref 7.350–7.450)
pO2, Arterial: 104 mmHg — ABNORMAL HIGH (ref 80.0–100.0)

## 2014-06-16 LAB — GRAM STAIN

## 2014-06-16 LAB — CBC WITH DIFFERENTIAL/PLATELET
BASOS PCT: 0 % (ref 0–1)
Basophils Absolute: 0 10*3/uL (ref 0.0–0.1)
EOS ABS: 0 10*3/uL (ref 0.0–0.7)
EOS PCT: 0 % (ref 0–5)
HEMATOCRIT: 38.5 % (ref 36.0–46.0)
Hemoglobin: 12.8 g/dL (ref 12.0–15.0)
Lymphocytes Relative: 4 % — ABNORMAL LOW (ref 12–46)
Lymphs Abs: 0.8 10*3/uL (ref 0.7–4.0)
MCH: 27.8 pg (ref 26.0–34.0)
MCHC: 33.2 g/dL (ref 30.0–36.0)
MCV: 83.5 fL (ref 78.0–100.0)
MONO ABS: 1 10*3/uL (ref 0.1–1.0)
Monocytes Relative: 5 % (ref 3–12)
Neutro Abs: 17.5 10*3/uL — ABNORMAL HIGH (ref 1.7–7.7)
Neutrophils Relative %: 91 % — ABNORMAL HIGH (ref 43–77)
Platelets: 275 10*3/uL (ref 150–400)
RBC: 4.61 MIL/uL (ref 3.87–5.11)
RDW: 14.2 % (ref 11.5–15.5)
WBC: 19.3 10*3/uL — ABNORMAL HIGH (ref 4.0–10.5)

## 2014-06-16 LAB — RAPID URINE DRUG SCREEN, HOSP PERFORMED
AMPHETAMINES: NOT DETECTED
BENZODIAZEPINES: POSITIVE — AB
Barbiturates: NOT DETECTED
Cocaine: NOT DETECTED
OPIATES: NOT DETECTED
TETRAHYDROCANNABINOL: NOT DETECTED

## 2014-06-16 LAB — GLUCOSE, CSF: Glucose, CSF: 121 mg/dL — ABNORMAL HIGH (ref 43–76)

## 2014-06-16 LAB — I-STAT TROPONIN, ED: Troponin i, poc: 0.01 ng/mL (ref 0.00–0.08)

## 2014-06-16 LAB — PROTIME-INR
INR: 0.98 (ref 0.00–1.49)
PROTHROMBIN TIME: 13 s (ref 11.6–15.2)

## 2014-06-16 LAB — ACETAMINOPHEN LEVEL

## 2014-06-16 LAB — URINALYSIS, ROUTINE W REFLEX MICROSCOPIC
Bilirubin Urine: NEGATIVE
GLUCOSE, UA: NEGATIVE mg/dL
KETONES UR: 40 mg/dL — AB
Leukocytes, UA: NEGATIVE
Nitrite: NEGATIVE
Protein, ur: 30 mg/dL — AB
SPECIFIC GRAVITY, URINE: 1.017 (ref 1.005–1.030)
Urobilinogen, UA: 0.2 mg/dL (ref 0.0–1.0)
pH: 6 (ref 5.0–8.0)

## 2014-06-16 LAB — SALICYLATE LEVEL

## 2014-06-16 LAB — MRSA PCR SCREENING: MRSA BY PCR: NEGATIVE

## 2014-06-16 LAB — CBG MONITORING, ED: Glucose-Capillary: 172 mg/dL — ABNORMAL HIGH (ref 70–99)

## 2014-06-16 LAB — PROTEIN, CSF: TOTAL PROTEIN, CSF: 28 mg/dL (ref 15–45)

## 2014-06-16 LAB — I-STAT CG4 LACTIC ACID, ED: Lactic Acid, Venous: 5 mmol/L — ABNORMAL HIGH (ref 0.5–2.2)

## 2014-06-16 MED ORDER — LEVOTHYROXINE SODIUM 100 MCG PO TABS
100.0000 ug | ORAL_TABLET | Freq: Every day | ORAL | Status: DC
  Administered 2014-06-17 – 2014-06-21 (×5): 100 ug via ORAL
  Filled 2014-06-16 (×6): qty 1

## 2014-06-16 MED ORDER — ENOXAPARIN SODIUM 40 MG/0.4ML ~~LOC~~ SOLN
40.0000 mg | SUBCUTANEOUS | Status: DC
  Administered 2014-06-16: 40 mg via SUBCUTANEOUS
  Filled 2014-06-16 (×2): qty 0.4

## 2014-06-16 MED ORDER — SIMVASTATIN 5 MG PO TABS
5.0000 mg | ORAL_TABLET | Freq: Every day | ORAL | Status: DC
  Administered 2014-06-17 – 2014-06-20 (×4): 5 mg via ORAL
  Filled 2014-06-16 (×5): qty 1

## 2014-06-16 MED ORDER — LEVETIRACETAM IN NACL 1000 MG/100ML IV SOLN
1000.0000 mg | Freq: Two times a day (BID) | INTRAVENOUS | Status: DC
  Administered 2014-06-17 – 2014-06-19 (×5): 1000 mg via INTRAVENOUS
  Filled 2014-06-16 (×6): qty 100

## 2014-06-16 MED ORDER — LORAZEPAM 2 MG/ML IJ SOLN: 1.0000 mg | INTRAMUSCULAR | Status: DC | PRN

## 2014-06-16 MED ORDER — SUCCINYLCHOLINE CHLORIDE 20 MG/ML IJ SOLN
100.0000 mg | Freq: Once | INTRAMUSCULAR | Status: DC
  Filled 2014-06-16: qty 5

## 2014-06-16 MED ORDER — VITAL HIGH PROTEIN PO LIQD
1000.0000 mL | ORAL | Status: DC
Start: 2014-06-16 — End: 2014-06-17
  Administered 2014-06-17 (×3)
  Administered 2014-06-17: 1000 mL
  Administered 2014-06-17 (×3)
  Filled 2014-06-16 (×3): qty 1000

## 2014-06-16 MED ORDER — ALBUTEROL SULFATE (2.5 MG/3ML) 0.083% IN NEBU: 2.5000 mg | INHALATION_SOLUTION | RESPIRATORY_TRACT | Status: DC | PRN

## 2014-06-16 MED ORDER — SODIUM CHLORIDE 0.9 % IV BOLUS (SEPSIS)
1000.0000 mL | Freq: Once | INTRAVENOUS | Status: AC
Start: 2014-06-16 — End: 2014-06-16
  Administered 2014-06-16: 1000 mL via INTRAVENOUS

## 2014-06-16 MED ORDER — PROPOFOL 10 MG/ML IV BOLUS
100.0000 mg | Freq: Once | INTRAVENOUS | Status: AC
  Administered 2014-06-16: 100 mg via INTRAVENOUS

## 2014-06-16 MED ORDER — PROPOFOL 10 MG/ML IV EMUL
5.0000 ug/kg/min | Freq: Once | INTRAVENOUS | Status: DC
  Administered 2014-06-16 (×2): 50 ug/kg/min via INTRAVENOUS
  Administered 2014-06-16: 30 ug/kg/min via INTRAVENOUS
  Administered 2014-06-16: 100 ug/kg/min via INTRAVENOUS

## 2014-06-16 MED ORDER — VANCOMYCIN HCL IN DEXTROSE 1-5 GM/200ML-% IV SOLN
1000.0000 mg | Freq: Two times a day (BID) | INTRAVENOUS | Status: DC
  Administered 2014-06-17: 1000 mg via INTRAVENOUS
  Filled 2014-06-16 (×2): qty 200

## 2014-06-16 MED ORDER — ETOMIDATE 2 MG/ML IV SOLN
INTRAVENOUS | Status: AC
  Filled 2014-06-16: qty 20

## 2014-06-16 MED ORDER — DEXAMETHASONE SODIUM PHOSPHATE 10 MG/ML IJ SOLN
20.0000 mg | Freq: Two times a day (BID) | INTRAMUSCULAR | Status: AC
  Administered 2014-06-16 – 2014-06-17 (×2): 20 mg via INTRAVENOUS
  Filled 2014-06-16 (×2): qty 2

## 2014-06-16 MED ORDER — SODIUM CHLORIDE 0.9 % IV SOLN
3.0000 g | Freq: Four times a day (QID) | INTRAVENOUS | Status: DC
  Administered 2014-06-17 (×3): 3 g via INTRAVENOUS
  Filled 2014-06-16 (×5): qty 3

## 2014-06-16 MED ORDER — AMPICILLIN SODIUM 2 G IJ SOLR
2.0000 g | Freq: Once | INTRAMUSCULAR | Status: DC
Start: 2014-06-16 — End: 2014-06-16
  Filled 2014-06-16: qty 2000

## 2014-06-16 MED ORDER — VANCOMYCIN HCL 10 G IV SOLR
2000.0000 mg | Freq: Once | INTRAVENOUS | Status: AC
  Administered 2014-06-16: 2000 mg via INTRAVENOUS
  Filled 2014-06-16: qty 2000

## 2014-06-16 MED ORDER — SODIUM CHLORIDE 0.9 % IV SOLN
INTRAVENOUS | Status: AC
  Administered 2014-06-16: 22:00:00 via INTRAVENOUS

## 2014-06-16 MED ORDER — DEXTROSE 5 % IV SOLN
2.0000 g | Freq: Two times a day (BID) | INTRAVENOUS | Status: DC
  Administered 2014-06-17: 2 g via INTRAVENOUS
  Filled 2014-06-16 (×2): qty 2

## 2014-06-16 MED ORDER — SUCCINYLCHOLINE CHLORIDE 20 MG/ML IJ SOLN
INTRAMUSCULAR | Status: DC | PRN
  Administered 2014-06-16: 100 mg via INTRAVENOUS

## 2014-06-16 MED ORDER — DEXAMETHASONE SODIUM PHOSPHATE 10 MG/ML IJ SOLN: 20.0000 mg | Freq: Two times a day (BID) | INTRAMUSCULAR | Status: DC

## 2014-06-16 MED ORDER — ROCURONIUM BROMIDE 50 MG/5ML IV SOLN
INTRAVENOUS | Status: AC
  Filled 2014-06-16: qty 2

## 2014-06-16 MED ORDER — SODIUM CHLORIDE 0.9 % IV SOLN
25.0000 ug/h | INTRAVENOUS | Status: DC
  Administered 2014-06-16: 25 ug/h via INTRAVENOUS
  Filled 2014-06-16 (×2): qty 50

## 2014-06-16 MED ORDER — LIDOCAINE HCL (CARDIAC) 20 MG/ML IV SOLN
INTRAVENOUS | Status: AC
  Filled 2014-06-16: qty 5

## 2014-06-16 MED ORDER — PROPOFOL 10 MG/ML IV EMUL
5.0000 ug/kg/min | INTRAVENOUS | Status: DC
  Administered 2014-06-16 – 2014-06-17 (×3): 70 ug/kg/min via INTRAVENOUS
  Administered 2014-06-17: 50 ug/kg/min via INTRAVENOUS
  Administered 2014-06-17: 30 ug/kg/min via INTRAVENOUS
  Administered 2014-06-17 (×2): 70 ug/kg/min via INTRAVENOUS
  Administered 2014-06-17: 35 ug/kg/min via INTRAVENOUS
  Administered 2014-06-18: 30 ug/kg/min via INTRAVENOUS
  Filled 2014-06-16 (×10): qty 100

## 2014-06-16 MED ORDER — DEXTROSE 5 % IV SOLN
2.0000 g | Freq: Once | INTRAVENOUS | Status: AC
  Administered 2014-06-16: 2 g via INTRAVENOUS
  Filled 2014-06-16: qty 2

## 2014-06-16 MED ORDER — FENTANYL CITRATE 0.05 MG/ML IJ SOLN: 100.0000 ug | INTRAMUSCULAR | Status: DC | PRN

## 2014-06-16 MED ORDER — IPRATROPIUM-ALBUTEROL 0.5-2.5 (3) MG/3ML IN SOLN
3.0000 mL | Freq: Four times a day (QID) | RESPIRATORY_TRACT | Status: DC
  Administered 2014-06-17 – 2014-06-21 (×15): 3 mL via RESPIRATORY_TRACT
  Filled 2014-06-16 (×15): qty 3

## 2014-06-16 MED ORDER — SODIUM CHLORIDE 0.9 % IV SOLN: 250.0000 mL | INTRAVENOUS | Status: DC | PRN

## 2014-06-16 MED ORDER — INSULIN ASPART 100 UNIT/ML ~~LOC~~ SOLN
2.0000 [IU] | SUBCUTANEOUS | Status: DC
  Administered 2014-06-17: 6 [IU] via SUBCUTANEOUS
  Administered 2014-06-17: 4 [IU] via SUBCUTANEOUS

## 2014-06-16 MED ORDER — SODIUM CHLORIDE 0.9 % IV BOLUS (SEPSIS)
1000.0000 mL | Freq: Once | INTRAVENOUS | Status: AC
  Administered 2014-06-16: 1000 mL via INTRAVENOUS

## 2014-06-16 MED ORDER — SUCCINYLCHOLINE CHLORIDE 20 MG/ML IJ SOLN
INTRAMUSCULAR | Status: AC
  Filled 2014-06-16: qty 1

## 2014-06-16 MED ORDER — FAMOTIDINE IN NACL 20-0.9 MG/50ML-% IV SOLN
20.0000 mg | Freq: Two times a day (BID) | INTRAVENOUS | Status: DC
  Administered 2014-06-16 – 2014-06-17 (×2): 20 mg via INTRAVENOUS
  Filled 2014-06-16 (×3): qty 50

## 2014-06-16 MED ORDER — ETOMIDATE 2 MG/ML IV SOLN
INTRAVENOUS | Status: DC | PRN
  Administered 2014-06-16: 30 mg via INTRAVENOUS

## 2014-06-16 MED ORDER — ETOMIDATE 2 MG/ML IV SOLN: 30.0000 mg | Freq: Once | INTRAVENOUS | Status: DC

## 2014-06-16 MED ORDER — LORAZEPAM 2 MG/ML IJ SOLN
INTRAMUSCULAR | Status: AC
  Administered 2014-06-16: 1 mg
  Filled 2014-06-16: qty 1

## 2014-06-16 MED ORDER — PROPOFOL 10 MG/ML IV EMUL
INTRAVENOUS | Status: AC
  Administered 2014-06-16 (×2): 50 mg/h
  Administered 2014-06-16: 100 mg
  Filled 2014-06-16: qty 100

## 2014-06-16 MED ORDER — LEVETIRACETAM IN NACL 1000 MG/100ML IV SOLN
1000.0000 mg | Freq: Once | INTRAVENOUS | Status: AC
  Administered 2014-06-16: 1000 mg via INTRAVENOUS
  Filled 2014-06-16: qty 100

## 2014-06-16 MED ORDER — DEXTROSE 5 % IV SOLN
1000.0000 mg | Freq: Three times a day (TID) | INTRAVENOUS | Status: DC
  Administered 2014-06-16: 1000 mg via INTRAVENOUS
  Filled 2014-06-16 (×3): qty 20

## 2014-06-16 NOTE — ED Notes (Signed)
Placed pt on airborne precautions- ruling out meningitis.

## 2014-06-16 NOTE — H&P (Signed)
PULMONARY / CRITICAL CARE MEDICINE   Name: Donna SarasConnie M [name] MRN: [id number] DOB: 05/25/1956    ADMISSION DATE:  06/16/2014 CONSULTATION DATE:  06/16/2014  REFERRING MD :  Patria Maneampos  CHIEF COMPLAINT:  Seizure, confusion  INITIAL PRESENTATION: 58 y/o female with multiple medical problems admitted on 8/20 for confusion and seizures after complaining of a headache and fever.  STUDIES:  8/20 CT Head > no acute intracranial process 8/20 LP >>  SIGNIFICANT EVENTS:   HISTORY OF PRESENT ILLNESS:  This 58 y/o female was brought in by EMS for being found unresponsive with seizure activity by her husband.  He provided the history because she was encephalopathic.  He stated that she had been complaining of a headache and fatigue for 2-3 days prior to the day of admission, but her mental status was normal.  He left for work on the morning of 8/20 and stated that she was talking to him normally while still in bed, but it was early in the morning and they did not have an extensive conversation.  After he returned home from work in the afternoon he found her lying face down, naked in their bedroom.  He tried to wake her but she had a seizure so he called 911.  In the ED she had another seizure in the CT scanner.  She has not been interactive or conversant since he found her this afternoon.    PAST MEDICAL HISTORY :  Past Medical History  Diagnosis Date  . Hypertension     takes Benazepril daily  . Hyperlipidemia     takes Pravastatin daily  . COPD (chronic obstructive pulmonary disease)   . Bronchitis     hx of-many yrs ago  . Arthritis     back   . Chronic back pain   . Diverticulosis   . GERD (gastroesophageal reflux disease)     takes Nexium daily  . Hypothyroidism     takes SYnthroid daily  . Anxiety     takes Xanax prn  . Diabetes mellitus     takes Metformin and GLipizide daily   Past Surgical History  Procedure Laterality Date  . Cesarean section      x 2  . Hemorrhoid surgery    .  Cholecystectomy    . Colonoscopy    . Back surgery    . Vaginal hysterectomy    . Tubal ligation     Prior to Admission medications   Medication Sig Start Date End Date Taking? Authorizing Provider  ALPRAZolam Prudy Feeler(XANAX) 0.5 MG tablet Take 0.5 mg by mouth at bedtime as needed. Sleep/anxiety.   Yes Historical Provider, MD  cyclobenzaprine (FLEXERIL) 10 MG tablet Take 1 tablet by mouth 3 (three) times daily as needed for muscle spasms.    Yes Historical Provider, MD  diclofenac (VOLTAREN) 75 MG EC tablet Take 75 mg by mouth 2 (two) times daily.   Yes Historical Provider, MD  escitalopram (LEXAPRO) 20 MG tablet Take 20 mg by mouth daily.  02/12/12  Yes Historical Provider, MD  gabapentin (NEURONTIN) 300 MG capsule Take 300 mg by mouth 3 (three) times daily.   Yes Historical Provider, MD  glipiZIDE (GLUCOTROL XL) 10 MG 24 hr tablet Take 10 mg by mouth daily with breakfast.   Yes Historical Provider, MD  levothyroxine (SYNTHROID, LEVOTHROID) 100 MCG tablet Take 100 mcg by mouth daily.   Yes Historical Provider, MD  oxyCODONE-acetaminophen (PERCOCET/ROXICET) 5-325 MG per tablet Take 1 tablet by mouth 2 (two) times daily  as needed for severe pain.   Yes Historical Provider, MD  pantoprazole (PROTONIX) 40 MG tablet Take 1 tablet by mouth daily.   Yes Historical Provider, MD  pravastatin (PRAVACHOL) 20 MG tablet Take 20 mg by mouth daily.   Yes Historical Provider, MD  PROVENTIL HFA 108 (90 BASE) MCG/ACT inhaler INHALE 2 PUFFS EVERY 6 HOURS AS NEEDED (COUGH, WHEEZE, OR CHEST CONGESTION) SHORTNESS OF BREATH 07/30/13  Yes Coralyn Helling, MD  traMADol (ULTRAM) 50 MG tablet Take 1 tablet by mouth every 6 (six) hours as needed for moderate pain.    Yes Historical Provider, MD  VOLTAREN 1 % GEL Apply 2 g topically 4 (four) times daily.    Yes Historical Provider, MD   Allergies  Allergen Reactions  . Other Other (See Comments)    Steri strip breaks out    FAMILY HISTORY/SOCIAL HISTORY/REVIEW OF SYSTEMS: cannot  obtain due to confusion  SUBJECTIVE:   VITAL SIGNS: Temp:  [100.5 F (38.1 C)] 100.5 F (38.1 C) (08/20 1446) Pulse Rate:  [104-120] 116 (08/20 1730) Resp:  [17-27] 26 (08/20 1730) BP: (127-176)/(60-99) 169/88 mmHg (08/20 1730) SpO2:  [89 %-100 %] 89 % (08/20 1730) HEMODYNAMICS:   VENTILATOR SETTINGS:   INTAKE / OUTPUT:  Intake/Output Summary (Last 24 hours) at 06/16/14 1836 Last data filed at 06/16/14 1540  Gross per 24 hour  Intake      0 ml  Output    375 ml  Net   -375 ml    PHYSICAL EXAMINATION: Gen: lying in bed, reaches for gown but non-conversant HEENT: NCAT, PERRL, EOMi, nasal trumpet PULM: CTA B CV: Tachy, regular, no mgr, no JVD AB: BS+, soft, nontender, no hsm Ext: warm, no edema, no clubbing, no cyanosis Derm: no rash or skin breakdown Neuro: awake and makes some purposeful movements but non-verbal, does not track or focus, does not protect airway   LABS:  CBC  Recent Labs Lab 06/16/14 1654 06/16/14 1703  WBC 19.3*  --   HGB 12.8 14.6  HCT 38.5 43.0  PLT 275  --    Coag's  Recent Labs Lab 06/16/14 1654  INR 0.98   BMET  Recent Labs Lab 06/16/14 1654 06/16/14 1703  NA 140 137  K 4.4 4.2  CL 97 99  CO2 22  --   BUN 14 14  CREATININE 1.02 1.00  GLUCOSE 199* 202*   Electrolytes  Recent Labs Lab 06/16/14 1654  CALCIUM 10.1   Sepsis Markers  Recent Labs Lab 06/16/14 1706  LATICACIDVEN 5.00*   ABG No results found for this basename: PHART, PCO2ART, PO2ART,  in the last 168 hours Liver Enzymes  Recent Labs Lab 06/16/14 1654  AST 44*  ALT 33  ALKPHOS 96  BILITOT 0.3  ALBUMIN 4.3   Cardiac Enzymes No results found for this basename: TROPONINI, PROBNP,  in the last 168 hours Glucose  Recent Labs Lab 06/16/14 1441  GLUCAP 172*    Imaging No results found.   ASSESSMENT / PLAN:  NEUROLOGIC A:  Acute encephalopathy in setting of fever, leukocytosis and seizures; ddx broad but most worrisome for infectious  syndrome like HSV encephalitis, other viral encephalitis or infectious meningitis, less likely tick borne process (no history of tick bites) Status epilepticus P:   RASS goal: -1 Neurology consult Fentanyl prn Daily WUA/SBT Keppra for now Likely needs MRI, start with LP tonight  PULMONARY OETT 8/20 >> A: Acute respiratory failure due to inability to protect airway COPD not in exacerbation P:  Full vent support duoneb q6h and prn albuterol abg 1 hr after ventilator CXR now and in AM Daily SBT/WUA  CARDIOVASCULAR CVL n/a A: Sepsis  Hypertension at baseline > on no antihypertensives at home? Lactic acidosis> due to seizure? P:  IVF  Tele Monitor BP  Repeat lactic acid  RENAL A:  No acute issues P:   Monitor UOP Daily BMET  GASTROINTESTINAL A:  No acute issues P: OG tube  pepcid for stress ulcer prophylaxis Start tube feedings  HEMATOLOGIC A:  No acute issues P:  Monitor for bleeding  INFECTIOUS A:  Meningitis vs Encephalitis BCx2 8/20 >> UC  8/20 >>> CSF bacterial culture 8/20 >> CSF HSV PCR 8/20 >> Urine strep 8/20 >> P:   Vanc 8/20 day 1/x Ceftriaxone high dose 8/20 1/x Ampicillin 8/20 1/x Acyclovir 8/20 1/x Decadron 20mg  q12h, will write for two doses, stop if this doesn't look like strep pneumo Assess CSF cell count, consider adding tick borne coverage   ENDOCRINE A:  DM2 P:   ICU hyperglycemia protocol  TODAY'S SUMMARY: 58 y/o female with picture worrisome for encephalitis, also with status epilepticus.  Intubated, needs STAT LP, antiviral and anti-meningitis coverage.  I have personally obtained a history, examined the patient, evaluated laboratory and imaging results, formulated the assessment and plan and placed orders. CRITICAL CARE: The patient is critically ill with multiple organ systems failure and requires high complexity decision making for assessment and support, frequent evaluation and titration of therapies, application of  advanced monitoring technologies and extensive interpretation of multiple databases. Critical Care Time devoted to patient care services described in this note is 45 minutes.   Heber Wolverine, MD [facility] Pager: [phone] Cell: [phone] If no response, call [phone]  06/16/2014, 6:36 PM

## 2014-06-16 NOTE — Procedures (Signed)
Preprocedure Dx: Seizure, possible meningitis Postprocedure Dx: Seizure, possible meningitis Procedure:  Fluoroscopically guided lumbar puncture Radiologist:  Tyron RussellBoles Anesthesia:  5 ml of 1% lidocaine Specimen:  10 ml CSF, clear colorless EBL:   < 1 ml Opening pressure: 29 cm H2O Complications: None

## 2014-06-16 NOTE — ED Notes (Signed)
Pt's husband states that pt has had headache for the past two days

## 2014-06-16 NOTE — ED Notes (Signed)
Pt still moving and requiring more sedation. Dr. Patria Maneampos at bedside ordered to give another 100mg  bolus of proprofol.

## 2014-06-16 NOTE — Procedures (Signed)
Central Venous Catheter Insertion Procedure Note Matthew SarasConnie M Bealer [id number] 02/26/1956  Procedure: Insertion of Central Venous Catheter Indications: Drug and/or fluid administration and Frequent blood sampling  Procedure Details Consent: Risks of procedure as well as the alternatives and risks of each were explained to the (patient/caregiver).  Consent for procedure obtained. Time Out: Verified patient identification, verified procedure, site/side was marked, verified correct patient position, special equipment/implants available, medications/allergies/relevent history reviewed, required imaging and test results available.  Performed  Maximum sterile technique was used including antiseptics, cap, gloves, gown, hand hygiene, mask and sheet. Skin prep: Chlorhexidine; local anesthetic administered A antimicrobial bonded/coated triple lumen catheter was placed in the left internal jugular vein using the Seldinger technique. Ultrasound guidance used.Yes.   Catheter placed to 20 cm. Blood aspirated via all 3 ports and then flushed x 3. Line sutured x 2 and dressing applied.  Evaluation Blood flow good Complications: No apparent complications Patient did tolerate procedure well. Chest X-ray ordered to verify placement.  CXR: pending.  Joneen RoachPaul Hoffman, ACNP [facility] Pager [phone] or [phone]  Attending:  Heber CarolinaBrent McQuaid, MD [place] [facility] Pager: [phone] Cell: [phone] If no response, call [phone]

## 2014-06-16 NOTE — Progress Notes (Signed)
Patient transported from floro to room 643m05 on 100% oxygen via ventilator. Patient remained stable during transport.

## 2014-06-16 NOTE — ED Notes (Signed)
Pt successfully intubated at 1838.

## 2014-06-16 NOTE — Progress Notes (Signed)
ANTIBIOTIC CONSULT NOTE - INITIAL  Pharmacy Consult for Vanco, Unasyn Indication: Meningitis  Allergies  Allergen Reactions  . Other Other (See Comments)    Steri strip breaks out    Patient Measurements: Height: 5' 1.81" (157 cm) Weight: 228 lb 6.3 oz (103.6 kg) (From 01/19/14) IBW/kg (Calculated) : 49.67 Adjusted Body Weight:   Vital Signs: Temp: 100.5 F (38.1 C) (08/20 1446) Temp src: Rectal (08/20 1446) BP: 164/136 mmHg (08/20 1930) Pulse Rate: 102 (08/20 1930) Intake/Output from previous day:   Intake/Output from this shift:    Labs:  Recent Labs  06/16/14 1654 06/16/14 1703  WBC 19.3*  --   HGB 12.8 14.6  PLT 275  --   CREATININE 1.02 1.00   Estimated Creatinine Clearance: 70.7 ml/min (by C-G formula based on Cr of 1). No results found for this basename: VANCOTROUGH, VANCOPEAK, VANCORANDOM, GENTTROUGH, GENTPEAK, GENTRANDOM, TOBRATROUGH, TOBRAPEAK, TOBRARND, AMIKACINPEAK, AMIKACINTROU, AMIKACIN,  in the last 72 hours   Microbiology: No results found for this or any previous visit (from the past 720 hour(s)).  Medical History: Past Medical History  Diagnosis Date  . Hypertension     takes Benazepril daily  . Hyperlipidemia     takes Pravastatin daily  . COPD (chronic obstructive pulmonary disease)   . Bronchitis     hx of-many yrs ago  . Arthritis     back   . Chronic back pain   . Diverticulosis   . GERD (gastroesophageal reflux disease)     takes Nexium daily  . Hypothyroidism     takes SYnthroid daily  . Anxiety     takes Xanax prn  . Diabetes mellitus     takes Metformin and GLipizide daily    Assessment: Donna [name] brought to the ED with seizures. Pt was complaining of headaches for the past 2 days. Pharmacy consulted to start empiric abx for meningitis.  ID: r/o meningitis. Temp 100.5. WBC 19.3. Scr 1. CrCl 70. Confirmed 4 abx with Dr. Kendrick FriesMcQuaid  8/20 Vanco>> 8/20 Unasyn>> 8/20 Rocephin>> 8/20 Acyclovir>>   8/20 Urine Cx>>  8/20  Blood Cx x2>>  8/20 CSF Cx >>   Goal of Therapy:  Vancomycin trough level 15-20 mcg/ml  Plan:  Azyclovir 10mg /kg/8hr= 1000mg  IV q8hr Rocephin 2g IV q12h Vancomycin 2g IV x 1 then 1000mg  IV q12hrs. Unasyn 3g IV q6hr.  Kelissa Merlin S. Merilynn Finlandobertson, PharmD, BCPS Clinical Staff Pharmacist Pager [phone]   Misty Stanleyobertson, Javien Tesch Stillinger 06/16/2014,8:49 PM

## 2014-06-16 NOTE — ED Notes (Signed)
Gave pt's dtr the pt's dentures

## 2014-06-16 NOTE — ED Notes (Signed)
Pt resting. Xray at bedside.

## 2014-06-16 NOTE — ED Notes (Signed)
Pt's husband walked into room and pt was found on floor unresponsive. Pt began seizing for about 1-2 minutes. Pt has no hx of seizures. EMS placed nasal trumpet and nonrebreather. Pupils dilated bilaterally per EMS. Pt responsive to pain with EMS, pt moving around- no purposeful movements. EKG from EMS shows ST 108. CBG 200's. BP 182/107. Baseline pt is normally A/Ox4.

## 2014-06-16 NOTE — ED Notes (Signed)
Informed RN on Donna [name] that pt still needs omnioen and vanc. meds will be sent up

## 2014-06-16 NOTE — Progress Notes (Signed)
Stat EEG completed, results pending  

## 2014-06-16 NOTE — Progress Notes (Signed)
ANTIBIOTIC CONSULT NOTE - INITIAL  Pharmacy Consult for Acyclovir Indication: CNS Infection   Allergies  Allergen Reactions  . Other Other (See Comments)    Steri strip breaks out    Patient Measurements:   Adjusted Body Weight: n/a   Vital Signs: Temp: 100.5 F (38.1 C) (08/20 1446) Temp src: Rectal (08/20 1446) BP: 164/136 mmHg (08/20 1930) Pulse Rate: 102 (08/20 1930) Intake/Output from previous day:   Intake/Output from this shift:    Labs:  Recent Labs  06/16/14 1654 06/16/14 1703  WBC 19.3*  --   HGB 12.8 14.6  PLT 275  --   CREATININE 1.02 1.00   The CrCl is unknown because both a height and weight (above a minimum accepted value) are required for this calculation. No results found for this basename: VANCOTROUGH, VANCOPEAK, VANCORANDOM, GENTTROUGH, GENTPEAK, GENTRANDOM, TOBRATROUGH, TOBRAPEAK, TOBRARND, AMIKACINPEAK, AMIKACINTROU, AMIKACIN,  in the last 72 hours   Microbiology: No results found for this or any previous visit (from the past 720 hour(s)).  Medical History: Past Medical History  Diagnosis Date  . Hypertension     takes Benazepril daily  . Hyperlipidemia     takes Pravastatin daily  . COPD (chronic obstructive pulmonary disease)   . Bronchitis     hx of-many yrs ago  . Arthritis     back   . Chronic back pain   . Diverticulosis   . GERD (gastroesophageal reflux disease)     takes Nexium daily  . Hypothyroidism     takes SYnthroid daily  . Anxiety     takes Xanax prn  . Diabetes mellitus     takes Metformin and GLipizide daily    Medications:   (Not in a hospital admission) Assessment: [age] YOF brought to the ED with seizures. Pt was complaining of headaches for the past 2 days. Pharmacy consulted to start empiric acyclovir for r/o CNS infection. Pt has also received a dose of Vancomycin and ampicillin in the ED. WBC is elevated at 19.3, Tm 100.5 F. LA elevated at 5. CrCl ~ 70 mL/min   Cultures: 8/20 Urine Cx>> 8/20 Blood Cx  x2>>  8/20 CSF Cx >>  Goal of Therapy:  Resolution of infection   Plan:  Start Acyclovir 10 mg/kg Q 8 hours  F/u resuming antibiotics upon admission  Monitor CBC, renal fx, cultures and patient's clinical progress  Vinnie LevelBenjamin Niclas Markell, PharmD.  Clinical Pharmacist Pager [phone]

## 2014-06-16 NOTE — Progress Notes (Signed)
Chaplain responded to ED for family support of intubated patient. Pt's husband found pt on the floor in her bedroom unresponsive. Pt's husband and brother-in-law were tearful, and expressed fear that patient will not pull through. Pt's husband also discussed tense unamiable family relationships with one daughter. Pt's other daughters arrived shortly. Chaplain provided hospitality, emotional and spiritual support, prayer, empathic listening, and acted as Health and safety inspectorliaison with staff. Patient in 59M waiting area. Will refer for follow up. Please page if needed.   Maurene CapesHillary D Irusta [phone]

## 2014-06-16 NOTE — ED Notes (Signed)
Pt's husband signed consent for pt to have LP in IR

## 2014-06-16 NOTE — ED Notes (Signed)
Pt still unconscious, restless, moving in bed, pt not speaking. Eyes rolling around. Pt's husband at bedside.

## 2014-06-16 NOTE — ED Notes (Signed)
Pt ready for transport to IR.

## 2014-06-16 NOTE — ED Notes (Signed)
Pt had seizure while in CT. Witnessed by CT staff. Staff stated that pt seized for about a minute. Her arms became very tense and she had some blood come from her mouth. RN came to bedside. HR 130's, nonrebreather on pt, sats 100%. Pt still unresponsive. Notified MD. Scan completed, pt back in room with husband at bedside

## 2014-06-16 NOTE — Consult Note (Addendum)
Neurology Consultation Reason for Consult: Altered mental status Referring Physician: Kendrick Fries, D.  CC: Altered mental status  History is obtained from: Nursing, medical record  HPI: Donna [name] is a 58 y.o. female who has been complaining of headache for the past 2 days, however was still relatively normal around 2 PM when he would to thank. He returned about an hour later to find her unresponsive on the floor. He then noticed that she began to have seizure activity. He called 911. After arrival here, she continued to be altered CT of her head was obtained and while she was getting a CT, she had recurrent seizure activity with bilateral stiffening of her arms.   LKW: 2 PM tpa given?: no, seizure onset    ROS:  Unable to obtain due to altered mental status.   Past Medical History  Diagnosis Date  . Hypertension     takes Benazepril daily  . Hyperlipidemia     takes Pravastatin daily  . COPD (chronic obstructive pulmonary disease)   . Bronchitis     hx of-many yrs ago  . Arthritis     back   . Chronic back pain   . Diverticulosis   . GERD (gastroesophageal reflux disease)     takes Nexium daily  . Hypothyroidism     takes SYnthroid daily  . Anxiety     takes Xanax prn  . Diabetes mellitus     takes Metformin and GLipizide daily    Family History: Unable to obtain due to altered mental status.  Social History: Tob: Unable to obtain due to altered mental status.  Exam: Current vital signs: BP 164/136  Pulse 102  Temp(Src) 100.5 F (38.1 C) (Rectal)  Resp 25  SpO2 100% Vital signs in last 24 hours: Temp:  [100.5 F (38.1 C)] 100.5 F (38.1 C) (08/20 1446) Pulse Rate:  [101-120] 102 (08/20 1930) Resp:  [17-27] 25 (08/20 1930) BP: (127-183)/(60-136) 164/136 mmHg (08/20 1930) SpO2:  [89 %-100 %] 100 % (08/20 1930) FiO2 (%):  [100 %] 100 % (08/20 1838)  General: In bed, NAD CV: Regular rate and rhythm Mental Status: Patient does not open eyes or follow  commands. She does not fixate or tract. She does respond to noxious stimuli with brisk withdrawal in all of her extremities.  Cranial Nerves: II: She does not blink to threat. Pupils are equal, round, and reactive to light.  Discs are difficult to visualize. III,IV, VI: Doll's eye maneuver is negative, however the significance is unclear in this partially awake patient. V, VII: Corneals are intact bilaterally VIII: Unable to obtain due to altered mental status. X, XI: Gag intact XII: Unable to assess secondary to patient's altered mental status.  Motor: Tone is normal. Bulk is normal. She does appear to have more spontaneous movement on the left than right. She does withdraw briskly in all 4 extremities. Sensory: Response to noxious stimulation in all 4 extremities Deep Tendon Reflexes: 2+ and symmetric in the biceps and patellae.  Cerebellar: Unable to obtain due to altered mental status. Gait: Unable to obtain due to altered mental status.     I have reviewed labs in epic and the results pertinent to this consultation are: Leukocytosis Normal sodium Normal calcium  I have reviewed the images obtained: CT head-no clear intracranial atrophy  Impression: 58 year old female presenting with fevers and altered mental status and new onset seizures in the setting of recent headaches. Concern for infectious cause of her altered mental status/seizures is  very high. I agree with perform a lumbar puncture and antibiotics have been ordered. She appears to have stopped seizing this point.  Recommendations: 1) agree with empiric antibiotic coverage 2) I will add on acyclovir pending CSF results 3) Keppra 1 g twice a day 4) further recommendations pending initial CSF results 5) will continue to follow   Ritta SlotMcNeill Evelyn Aguinaldo, MD [facility] [phone]  If 7pm- 7am, please page neurology on call as listed in AMION.   CSF not clearly inflammatory. EEG without NCSE. ? PRES.  Will get MRI brain  Ritta SlotMcNeill Donna Constantin, MD [facility] [phone]  If 7pm- 7am, please page neurology on call as listed in AMION.

## 2014-06-16 NOTE — Progress Notes (Signed)
Transported patient from emergency room to [facility]. Patient remained hemodynamically stable during transport.

## 2014-06-16 NOTE — ED Provider Notes (Signed)
CSN: [id number]     Arrival date & time 06/16/14  1429 History   First MD Initiated Contact with Patient 06/16/14 1444     Chief Complaint  Patient presents with  . Seizures   Donna [name] is a 58 yo caucasian F w/PMH of chronic back pain, HTN, HLD, COPD, hypothyroidism, and DM who presents w/new onset seizures. Husband last saw patient nml at approximately 2 PM before he went to the bank. When he returned about an hour later he found her unresponsive on the floor. She seemed to be breathing but was unconscious. He splashed some water on her face and she seemed to come to a little bit but then immediately began to seize. She seized for a couple minutes with generalized shaking but then was unresponsive. Patient called 911 and was encouraged to lay her flat on the floor that he was scared to do this with her COPD so he laid her in his lap. No further seizure activity while in route with ambulance. Patient has been endorses that she has had a headache for 2 days, but no other signs of infection.  Husband denies CP, SOB, fever, chills, N/V, diarrhea, constipation, hematemesis, dysuria, hematuria, sick contacts, or recent travel.   (Consider location/radiation/quality/duration/timing/severity/associated sxs/prior Treatment) Patient is a 58 y.o. female presenting with seizures.  Seizures Seizure activity on arrival: no   Seizure type:  Grand mal Initial focality:  None Episode characteristics: generalized shaking, stiffening and unresponsiveness   Postictal symptoms: somnolence   Return to baseline: no   Severity:  Severe Timing:  Once Number of seizures this episode:  1 Progression:  Partially resolved Context: not alcohol withdrawal, not change in medication, not drug use, not family hx of seizures, not fever and not previous head injury   Recent head injury:  No recent head injuries PTA treatment:  None History of seizures: no     Past Medical History  Diagnosis Date  . Hypertension      takes Benazepril daily  . Hyperlipidemia     takes Pravastatin daily  . COPD (chronic obstructive pulmonary disease)   . Bronchitis     hx of-many yrs ago  . Arthritis     back   . Chronic back pain   . Diverticulosis   . GERD (gastroesophageal reflux disease)     takes Nexium daily  . Hypothyroidism     takes SYnthroid daily  . Anxiety     takes Xanax prn  . Diabetes mellitus     takes Metformin and GLipizide daily   Past Surgical History  Procedure Laterality Date  . Cesarean section      x 2  . Hemorrhoid surgery    . Cholecystectomy    . Colonoscopy    . Back surgery    . Vaginal hysterectomy    . Tubal ligation     Family History  Problem Relation Age of Onset  . Anesthesia problems Neg Hx   . Hypotension Neg Hx   . Malignant hyperthermia Neg Hx   . Pseudochol deficiency Neg Hx   . Lung cancer Father   . Heart disease Mother    History  Substance Use Topics  . Smoking status: Former Smoker -- 3.00 packs/day for 30 years    Types: Cigarettes    Quit date: 03/31/2006  . Smokeless tobacco: Never Used  . Alcohol Use: No   OB History   Grav Para Term Preterm Abortions TAB SAB Ect Mult Living  Review of Systems  Unable to perform ROS Neurological: Positive for seizures.      Allergies  Other  Home Medications   Prior to Admission medications   Medication Sig Start Date End Date Taking? Authorizing Provider  ALPRAZolam Prudy Feeler) 0.5 MG tablet Take 0.5 mg by mouth at bedtime as needed. Sleep/anxiety.   Yes Historical Provider, MD  cyclobenzaprine (FLEXERIL) 10 MG tablet Take 1 tablet by mouth 3 (three) times daily as needed for muscle spasms.    Yes Historical Provider, MD  diclofenac (VOLTAREN) 75 MG EC tablet Take 75 mg by mouth 2 (two) times daily.   Yes Historical Provider, MD  escitalopram (LEXAPRO) 20 MG tablet Take 20 mg by mouth daily.  02/12/12  Yes Historical Provider, MD  gabapentin (NEURONTIN) 300 MG capsule Take 300 mg by  mouth 3 (three) times daily.   Yes Historical Provider, MD  glipiZIDE (GLUCOTROL XL) 10 MG 24 hr tablet Take 10 mg by mouth daily with breakfast.   Yes Historical Provider, MD  levothyroxine (SYNTHROID, LEVOTHROID) 100 MCG tablet Take 100 mcg by mouth daily.   Yes Historical Provider, MD  oxyCODONE-acetaminophen (PERCOCET/ROXICET) 5-325 MG per tablet Take 1 tablet by mouth 2 (two) times daily as needed for severe pain.   Yes Historical Provider, MD  pantoprazole (PROTONIX) 40 MG tablet Take 1 tablet by mouth daily.   Yes Historical Provider, MD  pravastatin (PRAVACHOL) 20 MG tablet Take 20 mg by mouth daily.   Yes Historical Provider, MD  PROVENTIL HFA 108 (90 BASE) MCG/ACT inhaler INHALE 2 PUFFS EVERY 6 HOURS AS NEEDED (COUGH, WHEEZE, OR CHEST CONGESTION) SHORTNESS OF BREATH 07/30/13  Yes Coralyn Helling, MD  traMADol (ULTRAM) 50 MG tablet Take 1 tablet by mouth every 6 (six) hours as needed for moderate pain.    Yes Historical Provider, MD  VOLTAREN 1 % GEL Apply 2 g topically 4 (four) times daily.    Yes Historical Provider, MD   BP 174/92  Temp(Src) 100.5 F (38.1 C) (Rectal)  Resp 18  SpO2 99% Physical Exam  Nursing note and vitals reviewed. Constitutional: She appears well-developed and well-nourished. No distress.  HENT:  Head: Normocephalic and atraumatic.  Nasal trumpet in right nare. Placed by ems.   Cardiovascular: Regular rhythm, normal heart sounds and intact distal pulses.  Exam reveals no gallop and no friction rub.   No murmur heard. Pulmonary/Chest: Effort normal and breath sounds normal. No respiratory distress. She has no wheezes. She has no rales. She exhibits no tenderness.  Abdominal: Soft. Bowel sounds are normal. She exhibits no distension and no mass. There is no tenderness. There is no rebound and no guarding.  Lymphadenopathy:    She has no cervical adenopathy.  Neurological:  Post-ictal.  Skin: Skin is warm and dry. No rash noted. She is not diaphoretic.    ED  Course  INTUBATION Date/Time: 06/16/2014 6:57 PM Performed by: Rachelle Hora Authorized by: Rachelle Hora Consent: The procedure was performed in an emergent situation. Time out: Immediately prior to procedure a "time out" was called to verify the correct patient, procedure, equipment, support staff and site/side marked as required. Indications: airway protection Intubation method: video-assisted Patient status: paralyzed (RSI) Preoxygenation: nonrebreather mask Sedatives: etomidate Paralytic: succinylcholine Laryngoscope size: Mac 3 Tube size: 7.5 mm Tube type: cuffed Number of attempts: 1 Breath sounds: equal Cuff inflated: yes ETT to lip: 25 cm ETT to teeth: 23 cm Tube secured with: ETT holder Chest x-ray interpreted by me. Chest x-ray findings: endotracheal  tube in appropriate position Patient tolerance: Patient tolerated the procedure well with no immediate complications.   (including critical care time) Labs Review Labs Reviewed  CBC WITH DIFFERENTIAL - Abnormal; Notable for the following:    WBC 19.3 (*)    Neutrophils Relative % 91 (*)    Neutro Abs 17.5 (*)    Lymphocytes Relative 4 (*)    All other components within normal limits  COMPREHENSIVE METABOLIC PANEL - Abnormal; Notable for the following:    Glucose, Bld 199 (*)    AST 44 (*)    GFR calc non Af Amer 60 (*)    GFR calc Af Amer 70 (*)    Anion gap 21 (*)    All other components within normal limits  URINALYSIS, ROUTINE W REFLEX MICROSCOPIC - Abnormal; Notable for the following:    Hgb urine dipstick MODERATE (*)    Ketones, ur 40 (*)    Protein, ur 30 (*)    All other components within normal limits  URINE RAPID DRUG SCREEN (HOSP PERFORMED) - Abnormal; Notable for the following:    Benzodiazepines POSITIVE (*)    All other components within normal limits  SALICYLATE LEVEL - Abnormal; Notable for the following:    Salicylate Lvl <2.0 (*)    All other components within normal limits  CBG MONITORING, ED  - Abnormal; Notable for the following:    Glucose-Capillary 172 (*)    All other components within normal limits  I-STAT CG4 LACTIC ACID, ED - Abnormal; Notable for the following:    Lactic Acid, Venous 5.00 (*)    All other components within normal limits  I-STAT CHEM 8, ED - Abnormal; Notable for the following:    Glucose, Bld 202 (*)    All other components within normal limits  CULTURE, BLOOD (ROUTINE X 2)  CULTURE, BLOOD (ROUTINE X 2)  CSF CULTURE  GRAM STAIN  URINE CULTURE  PROTIME-INR  ACETAMINOPHEN LEVEL  URINE MICROSCOPIC-ADD ON  CSF CELL COUNT WITH DIFFERENTIAL  CSF CELL COUNT WITH DIFFERENTIAL  GLUCOSE, CSF  PROTEIN, CSF  BLOOD GAS, VENOUS  I-STAT TROPOININ, ED    Imaging Review Ct Head Wo Contrast  06/16/2014   CLINICAL DATA:  Unresponsive, seizure activity  EXAM: CT HEAD WITHOUT CONTRAST  TECHNIQUE: Contiguous axial images were obtained from the base of the skull through the vertex without contrast.  COMPARISON:  None  FINDINGS: Mild brain atrophy without acute intracranial hemorrhage, mass lesion, definite infarction, mass effect, midline shift, herniation, or extra-axial fluid collection. Ventricles are symmetric. Cisterns are patent. Limited assessment of the posterior fossa because of positioning. Nasopharyngeal airway in place. Orbits are symmetric.  IMPRESSION: Limited exam but no acute intracranial process by noncontrast CT.   Electronically Signed   By: Ruel Favorsrevor  Shick M.D.   On: 06/16/2014 16:27   Dg Chest Port 1 View  06/16/2014   CLINICAL DATA:  Seizure and confusion.  EXAM: PORTABLE CHEST - 1 VIEW  COMPARISON:  12/23/2012  FINDINGS: Single view of the chest demonstrates slightly decreased lung volumes. Patient has a chronic curvilinear density at the left lung base that is more pronounced. Suspect this findings is related to atelectasis but difficult to exclude a small pleural effusion. Upper lungs are clear. Heart size is normal. The trachea is midline.   IMPRESSION: Left basilar densities probably related to a combination of volume loss and scar. Difficult to exclude small pleural effusion.   Electronically Signed   By: Meriel PicaAdam  Henn M.D.  On: 06/16/2014 15:29     EKG Interpretation None      MDM   58 yo caucasian F w/new onset sz. Please see HPI for details. On exam, pt withdraws from pain and is making spontaneous movement of extremities. Febrile at 100.4 rectally, tachycardic, hypertensive.  She apparently takes Klonopin often for chronic back pain. He doesn't believe she missed any doses. Seizure could be due to infection as patient is febrile but could also due to benzo withdrawal. Will obtain CBC, CMP, UA, UDS, tylenol/ASA levels, blood cx, chest x-ray, CT head.   A chest x-ray and CT head both grossly within normal limits. While patient was in the CT scanner she had another seizure with arms outstretched. This lasted for less than a minute. Remained postictal afterward. Neurology consulted. Keppra load IV.   EKG with sinus tach, no sign of acute ischemia or arrhythmia. Unchanged from prior EKG. Normal intervals.   Will proceed w/LP. However, pt has had spinal fusion in 2013 (L2-3). Consulted radiology for IR guided LP. Vanc, Rocephin, and Ampicillin IV.  WBC >19k, w/increased PMNs. Lactic acid 5.   Pt remains postictal. When attempting to remove oxygen, patient sats in the upper 80s. Therefore nonrebreather replaced. VBG added onto workup. Patient still not making purposeful movement and only withdraws limbs to pain. Considering intubation at this time. Consulted intensivist for their opinion and admission.  Will intubate pt in ED. See procedure note above.   Pt now on ventilator on propofol drip. Pt will be admitted to intensivist service. Please see their note, as well as Neurology's,  for further details regarding the remainder of her hospital course. Will head to IR for LP before moving to ICU.    Final diagnoses:  Seizure   Fever, unspecified fever cause    Pt was seen under the supervision of Dr. Rubin Payor.     Rachelle Hora, MD 06/16/14 1901  Rachelle Hora, MD 06/16/14 1324  Rachelle Hora, MD 06/17/14 [phone]

## 2014-06-16 NOTE — ED Notes (Signed)
Pt placed on droplet precautions per Critical Care MD

## 2014-06-17 ENCOUNTER — Inpatient Hospital Stay (HOSPITAL_COMMUNITY): Payer: 59

## 2014-06-17 DIAGNOSIS — R569 Unspecified convulsions: Secondary | ICD-10-CM

## 2014-06-17 DIAGNOSIS — G40401 Other generalized epilepsy and epileptic syndromes, not intractable, with status epilepticus: Secondary | ICD-10-CM

## 2014-06-17 DIAGNOSIS — J9600 Acute respiratory failure, unspecified whether with hypoxia or hypercapnia: Secondary | ICD-10-CM

## 2014-06-17 DIAGNOSIS — G9340 Encephalopathy, unspecified: Secondary | ICD-10-CM

## 2014-06-17 LAB — GLUCOSE, CAPILLARY
GLUCOSE-CAPILLARY: 210 mg/dL — AB (ref 70–99)
GLUCOSE-CAPILLARY: 230 mg/dL — AB (ref 70–99)
GLUCOSE-CAPILLARY: 254 mg/dL — AB (ref 70–99)
Glucose-Capillary: 117 mg/dL — ABNORMAL HIGH (ref 70–99)
Glucose-Capillary: 180 mg/dL — ABNORMAL HIGH (ref 70–99)
Glucose-Capillary: 209 mg/dL — ABNORMAL HIGH (ref 70–99)
Glucose-Capillary: 214 mg/dL — ABNORMAL HIGH (ref 70–99)

## 2014-06-17 LAB — BASIC METABOLIC PANEL
ANION GAP: 16 — AB (ref 5–15)
BUN: 10 mg/dL (ref 6–23)
CALCIUM: 8.6 mg/dL (ref 8.4–10.5)
CO2: 22 mEq/L (ref 19–32)
Chloride: 101 mEq/L (ref 96–112)
Creatinine, Ser: 0.98 mg/dL (ref 0.50–1.10)
GFR calc non Af Amer: 63 mL/min — ABNORMAL LOW (ref >90)
GFR, EST AFRICAN AMERICAN: 73 mL/min — AB (ref >90)
GLUCOSE: 195 mg/dL — AB (ref 70–99)
POTASSIUM: 3.3 meq/L — AB (ref 3.7–5.3)
Sodium: 139 mEq/L (ref 137–147)

## 2014-06-17 LAB — CBC
HEMATOCRIT: 32.2 % — AB (ref 36.0–46.0)
HEMOGLOBIN: 10.6 g/dL — AB (ref 12.0–15.0)
MCH: 27.7 pg (ref 26.0–34.0)
MCHC: 32.9 g/dL (ref 30.0–36.0)
MCV: 84.3 fL (ref 78.0–100.0)
Platelets: 220 10*3/uL (ref 150–400)
RBC: 3.82 MIL/uL — AB (ref 3.87–5.11)
RDW: 14.4 % (ref 11.5–15.5)
WBC: 11.3 10*3/uL — AB (ref 4.0–10.5)

## 2014-06-17 LAB — URINE CULTURE
Colony Count: NO GROWTH
Culture: NO GROWTH

## 2014-06-17 LAB — STREP PNEUMONIAE URINARY ANTIGEN: Strep Pneumo Urinary Antigen: NEGATIVE

## 2014-06-17 LAB — LACTIC ACID, PLASMA: Lactic Acid, Venous: 0.9 mmol/L (ref 0.5–2.2)

## 2014-06-17 MED ORDER — PRO-STAT SUGAR FREE PO LIQD
60.0000 mL | Freq: Two times a day (BID) | ORAL | Status: DC
  Administered 2014-06-17: 60 mL
  Filled 2014-06-17 (×3): qty 60

## 2014-06-17 MED ORDER — FAMOTIDINE 40 MG/5ML PO SUSR
20.0000 mg | Freq: Two times a day (BID) | ORAL | Status: DC
  Administered 2014-06-17 – 2014-06-18 (×2): 20 mg
  Filled 2014-06-17 (×5): qty 2.5

## 2014-06-17 MED ORDER — VITAL HIGH PROTEIN PO LIQD
1000.0000 mL | ORAL | Status: DC
  Administered 2014-06-17: 18:00:00
  Administered 2014-06-18: 1000 mL
  Filled 2014-06-17 (×2): qty 1000

## 2014-06-17 MED ORDER — NICARDIPINE HCL IN NACL 20-0.86 MG/200ML-% IV SOLN: 3.0000 mg/h | INTRAVENOUS | Status: DC

## 2014-06-17 MED ORDER — CHLORHEXIDINE GLUCONATE 0.12 % MT SOLN
15.0000 mL | Freq: Two times a day (BID) | OROMUCOSAL | Status: DC
Start: 2014-06-17 — End: 2014-06-18
  Administered 2014-06-17 – 2014-06-18 (×3): 15 mL via OROMUCOSAL
  Filled 2014-06-17 (×3): qty 15

## 2014-06-17 MED ORDER — CETYLPYRIDINIUM CHLORIDE 0.05 % MT LIQD
7.0000 mL | Freq: Four times a day (QID) | OROMUCOSAL | Status: DC
  Administered 2014-06-17 – 2014-06-18 (×4): 7 mL via OROMUCOSAL

## 2014-06-17 MED ORDER — INSULIN ASPART 100 UNIT/ML ~~LOC~~ SOLN
0.0000 [IU] | SUBCUTANEOUS | Status: DC
  Administered 2014-06-17 (×2): 5 [IU] via SUBCUTANEOUS
  Administered 2014-06-17: 8 [IU] via SUBCUTANEOUS
  Administered 2014-06-18: 2 [IU] via SUBCUTANEOUS
  Administered 2014-06-18: 3 [IU] via SUBCUTANEOUS
  Administered 2014-06-18 (×3): 2 [IU] via SUBCUTANEOUS
  Administered 2014-06-18: 5 [IU] via SUBCUTANEOUS
  Administered 2014-06-19: 2 [IU] via SUBCUTANEOUS

## 2014-06-17 MED ORDER — SODIUM CHLORIDE 0.9 % IV SOLN
INTRAVENOUS | Status: AC
  Administered 2014-06-18: 50 mL/h via INTRAVENOUS

## 2014-06-17 MED ORDER — ACYCLOVIR SODIUM 50 MG/ML IV SOLN
500.0000 mg | Freq: Three times a day (TID) | INTRAVENOUS | Status: DC
  Administered 2014-06-17 – 2014-06-18 (×4): 500 mg via INTRAVENOUS
  Filled 2014-06-17 (×5): qty 10

## 2014-06-17 NOTE — Progress Notes (Signed)
PULMONARY / CRITICAL CARE MEDICINE   Name: Donna [name] MRN: [id number] DOB: Feb 25, 1956    ADMISSION DATE:  06/16/2014 CONSULTATION DATE:  06/16/2014  REFERRING MD :  Patria Mane  CHIEF COMPLAINT:  Seizure, confusion  INITIAL PRESENTATION:  58 y/o female with COPD, chronic pain syndrome admitted on 8/20 for confusion and seizures after complaining of a headache and fever.  STUDIES:  8/20 CT Head: no acute intracranial process 8/20 LP: negative (1-3 WBC/cubic mm) 8/21 MRI brain:   SIGNIFICANT EVENTS: 8/20 Neuro Consult: empiric coverage for meningitis, HSV encephalitis initiated. Keppra initiated  SUBJECTIVE:  RASS -5 on propofol. Not F/C  VITAL SIGNS: Temp:  [98.5 F (36.9 C)-100.9 F (38.3 C)] 99.3 F (37.4 C) (08/21 0800) Pulse Rate:  [89-120] 91 (08/21 1100) Resp:  [13-27] 13 (08/21 1100) BP: (116-183)/(53-136) 148/69 mmHg (08/21 1100) SpO2:  [89 %-100 %] 100 % (08/21 1000) FiO2 (%):  [40 %-100 %] 40 % (08/21 1111) Weight:  [100.4 kg (221 lb 5.5 oz)-103.6 kg (228 lb 6.3 oz)] 100.4 kg (221 lb 5.5 oz) (08/21 0500) HEMODYNAMICS:   VENTILATOR SETTINGS: Vent Mode:  [-] PRVC FiO2 (%):  [40 %-100 %] 40 % Set Rate:  [14 bmp] 14 bmp Vt Set:  [450 mL-500 mL] 450 mL PEEP:  [5 cmH20] 5 cmH20 Plateau Pressure:  [12 cmH20-16 cmH20] 15 cmH20 INTAKE / OUTPUT:  Intake/Output Summary (Last 24 hours) at 06/17/14 1145 Last data filed at 06/17/14 1100  Gross per 24 hour  Intake 3326.19 ml  Output   2000 ml  Net 1326.19 ml    PHYSICAL EXAMINATION: Gen: NAD, unresponsive HEENT: NCAT, PERRL PULM: Clear anteriorly, no wheezes CV: reg, no M AB: BS+, soft Ext: warm, no edema Neuro: No focal deficits noted   LABS: I have reviewed all of today's lab results. Relevant abnormalities are discussed in the A/P section  CXR: NAD  ASSESSMENT / PLAN:  NEUROLOGIC A:  Acute encephalopathy, unclear etiology Status epilepticus P:   RASS goal: -2 Further eval per Neuro to include  MRI Neurology managing AEDs  Daily WUA  PULMONARY ETT 8/20 >> A: Acute respiratory failure due to inability to protect airway COPD not in exacerbation P:   Cont vent support - settings reviewed and/or adjusted Cont vent bundle Daily SBT if/when meets criteria  CARDIOVASCULAR L IJ CVL 8/21 >>   A:   H/O hypertension P:  Monitor BP and rhythm  RENAL A:   Mild hypokalemia Lactic acidosis, resolved P:   Monitor BMET intermittently Monitor I/Os Correct electrolytes as indicated  GASTROINTESTINAL A:   No acute issues P: SUP: enteral famotidine Cont TFs  HEMATOLOGIC A:   Mild anemia without acute blood loss P:  DVT px: SQ LMWH Monitor CBC intermittently Transfuse per usual ICU guidelines  INFECTIOUS A:   Suspected Encephalitis Bacterial meningitis R/O'd by LP BCx2 8/20 >>  UC  8/20 >>  CSF bacterial culture 8/20 >>  CSF HSV PCR 8/20 >>  Urine strep 8/20 >>  P:   Vanc 8/20 >> 8/21 Ceftriaxone 8/20 >> 8/21 Ampicillin 8/20 >> 8/21 Acyclovir 8/20 >>  D/C dexamethasone  ENDOCRINE A:  DM2 P:   ICU hyperglycemia protocol  TODAY'S SUMMARY:    I have personally obtained a history, examined the patient, evaluated laboratory and imaging results, formulated the assessment and plan and placed orders. CRITICAL CARE: The patient is critically ill with multiple organ systems failure and requires high complexity decision making for assessment and support, frequent evaluation and titration  of therapies, application of advanced monitoring technologies and extensive interpretation of multiple databases. Critical Care Time devoted to patient care services described in this note is 40 minutes.   Billy Fischeravid Stassi Fadely, MD ; [facility] Mobile [phone].  After 5:30 PM or weekends, call [phone]  06/17/2014, 11:45 AM

## 2014-06-17 NOTE — Procedures (Signed)
History: 58 year old female admitted with seizures and altered mental status  Sedation: Propofol  Technique: This is a 17 channel routine scalp EEG performed at the bedside with bipolar and monopolar montages arranged in accordance to the international 10/20 system of electrode placement. One channel was dedicated to EKG recording.    Background: The recording begins with generalized area delta activity with some superimposed spindles which are frontocentrally predominant. This pattern attenuates to some degree with noxious stimuli no quickly returned on cessation noxious stimuli. There are recurrent runs of centrally predominant beta activity consistent with sleep spindles. These occur frequently at times.  Photic stimulation: Physiologic driving is not performed  EEG Abnormalities: 1) generalized regular delta activity  Clinical Interpretation: This EEG is most consistent with  Encephalopathy associated with medication effect. There was no seizure or seizure predisposition recorded on this study.   Donna SlotMcNeill Kirkpatrick, MD [facility] [phone]  If 7pm- 7am, please page neurology on call as listed in AMION.

## 2014-06-17 NOTE — Progress Notes (Signed)
INITIAL NUTRITION ASSESSMENT  DOCUMENTATION CODES Per approved criteria  -Obesity Unspecified   INTERVENTION:  Decrease Vital HP infusion to 4520ml/hr, adding Prostat liquid protein 30 ml QID to provide 880 kcals, 102 gm protein (85% of estimated protein needs), 401 ml of free water. TF regimen plus current Propofol infusion will meet 71% of estimated kcal needs. Unable to meet 100% of estimated protein needs with Propofol use.   Will continue to monitor.   NUTRITION DIAGNOSIS: Inadequate oral intake related to inability to eat as evidenced by NPO status  Goal: Enteral nutrition to provide 60-70% of estimated calorie needs (22-25 kcals/kg ideal body weight) and 100% of estimated protein needs, based on ASPEN guidelines for permissive underfeeding in critically ill obese individuals  Monitor:  TF regimen and tolerance, Propofol infusion, respiratory status, weight, labs, I/O's  Reason for Assessment: Consult for TF initiation and management  58 y.o. female  Admitting Dx: Status epilepticus , Altered Mental Status  ASSESSMENT: 58 y/o female with multiple medical problems admitted on 8/20 for confusion and seizures after complaining of a headache and fever. After arrival here, she continued to be altered CT of her head was obtained and while she was getting a CT, she had recurrent seizure activity with bilateral stiffening of her arms.  Vital HP formula initiated via Adult TF protocol 8/20. Pt currently receiving Vital HP formula @ 30 ml/hr via OGT (tip in stomach), providing 720 kcal (35% of estimated needs), 63g Pro (53% of estimated needs) and 602 ml of free water. Current goal rate is 7740ml/hr.  Patient is currently intubated on ventilator support - OGT in place MV: 8.5 L/min Temp (24hrs), Avg:99.7 F (37.6 C), Min:98.5 F (36.9 C), Max:100.9 F (38.3 C)  Propofol: Current rate: 21.8 ml/hr. Providing 575 fat kcal/d.  No muscle or fat depletion noticed.  Labs reviewed: Low  Potassium, Elevated Glucose (195)  Height: Ht Readings from Last 1 Encounters:  06/16/14 5\' 3"  (1.6 m)    Weight: Wt Readings from Last 1 Encounters:  06/17/14 221 lb 5.5 oz (100.4 kg)    Ideal Body Weight: 115 lb  % Ideal Body Weight: 192%  Wt Readings from Last 10 Encounters:  06/17/14 221 lb 5.5 oz (100.4 kg)  01/19/14 228 lb 6.4 oz (103.602 kg)  05/21/13 232 lb 12.8 oz (105.597 kg)  01/21/13 230 lb 6.4 oz (104.509 kg)  12/23/12 226 lb 12.8 oz (102.876 kg)  06/23/12 219 lb 6.4 oz (99.519 kg)  05/20/12 224 lb 3.2 oz (101.696 kg)  03/31/12 236 lb 15.9 oz (107.5 kg)  03/31/12 236 lb 15.9 oz (107.5 kg)  03/25/12 237 lb 3.2 oz (107.593 kg)    Usual Body Weight: ~230 lb noted  % Usual Body Weight: 96%  BMI:  Body mass index is 39.22 kg/(m^2).  Estimated Nutritional Needs: Kcal: 2035 Protein: 120-130 g Fluid: 2 L/day  Skin: intact  Diet Order:    EDUCATION NEEDS: -No education needs identified at this time   Intake/Output Summary (Last 24 hours) at 06/17/14 1044 Last data filed at 06/17/14 1025  Gross per 24 hour  Intake 3094.39 ml  Output   1825 ml  Net 1269.39 ml    Last BM: Not documented           Labs:   Recent Labs Lab 06/16/14 1654 06/16/14 1703 06/17/14 0630  NA 140 137 139  K 4.4 4.2 3.3*  CL 97 99 101  CO2 22  --  22  BUN 14 14 10  CREATININE 1.02 1.00 0.98  CALCIUM 10.1  --  8.6  GLUCOSE 199* 202* 195*    CBG (last 3)   Recent Labs  06/17/14 0005 06/17/14 0344 06/17/14 0813  GLUCAP 117* 180* 210*    Scheduled Meds: . acyclovir  500 mg Intravenous 3 times per day  . ampicillin-sulbactam (UNASYN) IV  3 g Intravenous Q6H  . antiseptic oral rinse  7 mL Mouth Rinse QID  . cefTRIAXone (ROCEPHIN)  IV  2 g Intravenous Q12H  . chlorhexidine  15 mL Mouth Rinse BID  . dexamethasone (DECADRON) IVPB  20 mg Intravenous Q12H  . enoxaparin (LOVENOX) injection  40 mg Subcutaneous Q24H  . etomidate  30 mg Intravenous Once  .  famotidine (PEPCID) IV  20 mg Intravenous Q12H  . feeding supplement (VITAL HIGH PROTEIN)  1,000 mL Per Tube Q24H  . insulin aspart  2-6 Units Subcutaneous 6 times per day  . ipratropium-albuterol  3 mL Nebulization Q6H  . levETIRAcetam  1,000 mg Intravenous Q12H  . levothyroxine  100 mcg Oral QAC breakfast  . simvastatin  5 mg Oral q1800  . succinylcholine  100 mg Intravenous Once  . vancomycin  1,000 mg Intravenous Q12H    Continuous Infusions: . sodium chloride 75 mL/hr at 06/17/14 1000  . fentaNYL infusion INTRAVENOUS 50 mcg/hr (06/17/14 1000)  . propofol 35 mcg/kg/min (06/17/14 1000)    Past Medical History  Diagnosis Date  . Hypertension     takes Benazepril daily  . Hyperlipidemia     takes Pravastatin daily  . COPD (chronic obstructive pulmonary disease)   . Bronchitis     hx of-many yrs ago  . Arthritis     back   . Chronic back pain   . Diverticulosis   . GERD (gastroesophageal reflux disease)     takes Nexium daily  . Hypothyroidism     takes SYnthroid daily  . Anxiety     takes Xanax prn  . Diabetes mellitus     takes Metformin and GLipizide daily    Past Surgical History  Procedure Laterality Date  . Cesarean section      x 2  . Hemorrhoid surgery    . Cholecystectomy    . Colonoscopy    . Back surgery    . Vaginal hysterectomy    . Tubal ligation      Tilda Franco, MS, PLDN Provisionally Licensed Dietitian Nutritionist Pager: [phone]

## 2014-06-17 NOTE — Progress Notes (Addendum)
Note/chart reviewed.  Katie Kaneisha Ellenberger, RD, LDN Pager #: [phone] After-Hours Pager #: [phone]  

## 2014-06-17 NOTE — Progress Notes (Signed)
Subjective: No recurrence of seizure activity reported. CSF results showed no signs of acute meningitis. Patient is currently on antibiotic and in coverage. Low-grade fever continues.  Objective: Current vital signs: BP 130/63  Pulse 99  Temp(Src) 99.3 F (37.4 C) (Oral)  Resp 17  Ht 5\' 3"  (1.6 m)  Wt 100.4 kg (221 lb 5.5 oz)  BMI 39.22 kg/m2  SpO2 98%  Neurologic Exam: Intubated and on mechanical ventilation. Patient is also on sedation with fentanyl and propofol. She respond with grimacing to noxious stimuli. Pupils were equal and reacted normally to light. Extraocular movements are absent with oculocephalic maneuvers. No facial weakness was noted. Muscle tone was flaccid throughout. Patient had no abnormal posturing as well as minimal nonpurposeful spontaneous movements of upper extremities. Deep tendon reflexes were 2+ and symmetrical. Plantar responses were flexor bilaterally.  EEG on 06/15/2014 showed generalized nonspecific slowing. No epileptic activity was reported.  Medications: I have reviewed the patient's current medications.  Assessment/Plan: 58 year old lady presenting with headache, low-grade fever and seizure activity. Etiology remains unclear. CSF showed no signs of acute infectious/inflammatory CNS abnormality. CT scan of her head was unremarkable.  Recommendations: 1. MRI of the brain is planned. 2. Continue Keppra 1000 mg IV every 12 hours. 3. Continue acyclovir for now. May discontinue acyclovir MRI study shows no indication acute encephalitis.  We will continue to follow this patient with you.  C.R. Roseanne RenoStewart, MD [facility] [phone]  06/17/2014  9:06 AM

## 2014-06-17 NOTE — Progress Notes (Signed)
Chaplain responded to referral from overnight chaplain. Pt's husband and brother-in-law present this morning. They stated they felt they didn't know "where they are going" or what is happening with the pt. Husband expressed frustration that he had not spoken to the MD yet this morning. Chaplain told family she would communicate these concerns to the staff.

## 2014-06-17 NOTE — ED Notes (Signed)
The patient is being cared for by the prior physician and the resident.  I became involved later in the patient's course.  I reviewed the case with the resident sound as though the patient was not returning to baseline mental status.  I went and evaluated the patient with the resident she appeared altered.  Critical care was consulted and a decision was made to intubate for airway protection.  I was personally present and supervised the resident intubation.  I updated the family.  Patient was started on antibiotics for concern for meningitis.  She was arranged to go to fluoroscopy for lumbar puncture.  I discussed her case with critical care who agrees to admit the patient to the intensive care unit.  Patient had minimal vascular access and therefore a left external jugular was placed  Angiocath insertion Performed by: Lyanne CoAMPOS,Nikitia Asbill M Consent: Verbal consent obtained. Risks and benefits: risks, benefits and alternatives were discussed Time out: Immediately prior to procedure a "time out" was called to verify the correct patient, procedure, equipment, support staff and site/side marked as required. Preparation: Patient was prepped and draped in the usual sterile fashion. Vein Location: left external jugular Gauge: 20 Normal blood return and flush without difficulty Patient tolerance: Patient tolerated the procedure well with no immediate complications.  CRITICAL CARE Performed by: Lyanne CoAMPOS,Devon Pretty M Total critical care time: 35 Critical care time was exclusive of separately billable procedures and treating other patients. Critical care was necessary to treat or prevent imminent or life-threatening deterioration. Critical care was time spent personally by me on the following activities: development of treatment plan with patient and/or surrogate as well as nursing, discussions with consultants, evaluation of patient's response to treatment, examination of patient, obtaining history from patient or  surrogate, ordering and performing treatments and interventions, ordering and review of laboratory studies, ordering and review of radiographic studies, pulse oximetry and re-evaluation of patient's condition.  INTUBATION Performed by: Dr Rachelle HoraKeri Smith, MD Required items: required blood products, implants, devices, and special equipment available Patient identity confirmed: provided demographic data and hospital-assigned identification number Time out: Immediately prior to procedure a "time out" was called to verify the correct patient, procedure, equipment, support staff and site/side marked as required. Please see resident note for complete detail   Ct Head Wo Contrast  06/16/2014   CLINICAL DATA:  Unresponsive, seizure activity  EXAM: CT HEAD WITHOUT CONTRAST  TECHNIQUE: Contiguous axial images were obtained from the base of the skull through the vertex without contrast.  COMPARISON:  None  FINDINGS: Mild brain atrophy without acute intracranial hemorrhage, mass lesion, definite infarction, mass effect, midline shift, herniation, or extra-axial fluid collection. Ventricles are symmetric. Cisterns are patent. Limited assessment of the posterior fossa because of positioning. Nasopharyngeal airway in place. Orbits are symmetric.  IMPRESSION: Limited exam but no acute intracranial process by noncontrast CT.   Electronically Signed   By: Ruel Favorsrevor  Shick M.D.   On: 06/16/2014 16:27   Dg Chest Port 1 View  06/17/2014   CLINICAL DATA:  Status post central line.  EXAM: PORTABLE CHEST - 1 VIEW  COMPARISON:  06/16/2014 at 2138 hr.  FINDINGS: Endotracheal tube ends between the clavicles and carina. The orogastric tube reaches the stomach.  New left IJ catheter, tip obscured by EKG wires, but likely at the SVC.  Normal heart size and upper mediastinal contours. Unchanged curvilinear density at the peripheral left base. There is increasing interstitial markings, accentuated by lower lung volumes. No pneumothorax.   IMPRESSION: 1. New left IJ  catheter.  No adverse findings. 2. Lower lung volumes but symmetric pulmonary inflation.   Electronically Signed   By: Tiburcio Pea M.D.   On: 06/17/2014 00:19   Dg Chest Portable 1 View  06/16/2014   CLINICAL DATA:  Intubation.  EXAM: PORTABLE CHEST - 1 VIEW  COMPARISON:  06/16/2014  FINDINGS: Endotracheal tube ends between the carina and clavicular heads. An orogastric tube reaches the stomach.  No cardiomegaly for technique. Stable upper mediastinal contours. No edema, consolidation, or pneumothorax. Curvilinear opacity at the left base again noted. As noted previously, appearance favors scarring or atelectasis over pleural effusion.  IMPRESSION: 1. New endotracheal and orogastric tubes are in good position. 2. Stable pulmonary inflation.   Electronically Signed   By: Tiburcio Pea M.D.   On: 06/16/2014 22:07   Dg Chest Port 1 View  06/16/2014   CLINICAL DATA:  Seizure and confusion.  EXAM: PORTABLE CHEST - 1 VIEW  COMPARISON:  12/23/2012  FINDINGS: Single view of the chest demonstrates slightly decreased lung volumes. Patient has a chronic curvilinear density at the left lung base that is more pronounced. Suspect this findings is related to atelectasis but difficult to exclude a small pleural effusion. Upper lungs are clear. Heart size is normal. The trachea is midline.  IMPRESSION: Left basilar densities probably related to a combination of volume loss and scar. Difficult to exclude small pleural effusion.   Electronically Signed   By: Richarda Overlie M.D.   On: 06/16/2014 15:29   Dg Lumbar Puncture Fluoro Guide  06/16/2014   CLINICAL DATA:  Fever, seizures, possible meningitis  EXAM: DIAGNOSTIC LUMBAR PUNCTURE UNDER FLUOROSCOPIC GUIDANCE  FLUOROSCOPY TIME:  0 min 39 seconds  PROCEDURE: Written informed consent was obtained from the patient's husband by emergency department staff; patient intubated.  Timeout protocol followed.  Patient due to patient intubation, patient was  placed in a LEFT lateral decubitus position.  L2-L3 disc space was localized under fluoroscopy.  Skin prepped and draped in usual sterile fashion.  Skin and soft tissues anesthetized with 5 of 1% lidocaine.  Twenty needle was advanced into the spinal canal where clear colorless CSF was encountered with an opening pressure of 29 cm H2O.  10 mL of CSF was obtained in 4 tubes for requested analysis.  Procedure tolerated very well by patient without immediate complication.  IMPRESSION: Fluoroscopic guided lumbar puncture as above.   Electronically Signed   By: Ulyses Southward M.D.   On: 06/16/2014 20:42         Lyanne Co, MD 06/17/14 1426

## 2014-06-18 ENCOUNTER — Inpatient Hospital Stay (HOSPITAL_COMMUNITY): Payer: 59

## 2014-06-18 LAB — CBC
HCT: 29.4 % — ABNORMAL LOW (ref 36.0–46.0)
HEMOGLOBIN: 9.5 g/dL — AB (ref 12.0–15.0)
MCH: 27.4 pg (ref 26.0–34.0)
MCHC: 32.3 g/dL (ref 30.0–36.0)
MCV: 84.7 fL (ref 78.0–100.0)
PLATELETS: 185 10*3/uL (ref 150–400)
RBC: 3.47 MIL/uL — AB (ref 3.87–5.11)
RDW: 14.7 % (ref 11.5–15.5)
WBC: 9.8 10*3/uL (ref 4.0–10.5)

## 2014-06-18 LAB — GLUCOSE, CAPILLARY
GLUCOSE-CAPILLARY: 129 mg/dL — AB (ref 70–99)
GLUCOSE-CAPILLARY: 148 mg/dL — AB (ref 70–99)
GLUCOSE-CAPILLARY: 172 mg/dL — AB (ref 70–99)
GLUCOSE-CAPILLARY: 89 mg/dL (ref 70–99)
Glucose-Capillary: 141 mg/dL — ABNORMAL HIGH (ref 70–99)
Glucose-Capillary: 149 mg/dL — ABNORMAL HIGH (ref 70–99)

## 2014-06-18 LAB — BASIC METABOLIC PANEL
Anion gap: 13 (ref 5–15)
BUN: 16 mg/dL (ref 6–23)
CHLORIDE: 106 meq/L (ref 96–112)
CO2: 26 mEq/L (ref 19–32)
CREATININE: 0.82 mg/dL (ref 0.50–1.10)
Calcium: 8.2 mg/dL — ABNORMAL LOW (ref 8.4–10.5)
GFR, EST NON AFRICAN AMERICAN: 79 mL/min — AB (ref >90)
Glucose, Bld: 195 mg/dL — ABNORMAL HIGH (ref 70–99)
Potassium: 3.3 mEq/L — ABNORMAL LOW (ref 3.7–5.3)
Sodium: 145 mEq/L (ref 137–147)

## 2014-06-18 LAB — TRIGLYCERIDES: Triglycerides: 125 mg/dL (ref <150)

## 2014-06-18 MED ORDER — LORAZEPAM 2 MG/ML IJ SOLN: 1.0000 mg | INTRAMUSCULAR | Status: DC | PRN

## 2014-06-18 MED ORDER — FENTANYL CITRATE 0.05 MG/ML IJ SOLN: 25.0000 ug | INTRAMUSCULAR | Status: DC | PRN

## 2014-06-18 MED ORDER — HEPARIN SODIUM (PORCINE) 5000 UNIT/ML IJ SOLN
5000.0000 [IU] | Freq: Three times a day (TID) | INTRAMUSCULAR | Status: DC
  Administered 2014-06-18 – 2014-06-21 (×8): 5000 [IU] via SUBCUTANEOUS
  Filled 2014-06-18 (×11): qty 1

## 2014-06-18 MED ORDER — PANTOPRAZOLE SODIUM 40 MG IV SOLR
40.0000 mg | INTRAVENOUS | Status: DC
  Administered 2014-06-18: 40 mg via INTRAVENOUS
  Filled 2014-06-18 (×2): qty 40

## 2014-06-18 MED ORDER — POTASSIUM CHLORIDE 20 MEQ/15ML (10%) PO LIQD
40.0000 meq | Freq: Once | ORAL | Status: AC
  Administered 2014-06-18: 40 meq
  Filled 2014-06-18: qty 30

## 2014-06-18 NOTE — Progress Notes (Addendum)
PULMONARY / CRITICAL CARE MEDICINE   Name: Donna SarasConnie M Rotenberg MRN: [id number] DOB: 03/06/1956    ADMISSION DATE:  06/16/2014 CONSULTATION DATE:  06/16/2014  REFERRING MD :  Patria Maneampos  CHIEF COMPLAINT:  Seizure, confusion  INITIAL PRESENTATION:  58 y/o female with COPD, chronic pain syndrome admitted on 8/20 for confusion and seizures after complaining of a headache and fever.  STUDIES:  8/20 CT Head: no acute intracranial process 8/20 LP: negative (1-3 WBC/cubic mm) 8/21 MRI brain: Small areas of symmetric cortical and subcortical edema in the frontal, parietal and occipital lobes bilaterally - hypertensive encephalopathy vs Seizure   SIGNIFICANT EVENTS: 8/20 Neuro Consult: empiric coverage for meningitis, HSV encephalitis initiated. Keppra initiated  SUBJECTIVE:  RASS 0 off propofol.  afebrile   VITAL SIGNS: Temp:  [98.2 F (36.8 C)-99.3 F (37.4 C)] 98.4 F (36.9 C) (08/22 0739) Pulse Rate:  [71-112] 71 (08/22 0700) Resp:  [11-21] 13 (08/22 0700) BP: (105-155)/(45-75) 109/61 mmHg (08/22 0700) SpO2:  [97 %-100 %] 100 % (08/22 0700) FiO2 (%):  [40 %] 40 % (08/22 0400) Weight:  [100.8 kg (222 lb 3.6 oz)] 100.8 kg (222 lb 3.6 oz) (08/22 0418) HEMODYNAMICS:   VENTILATOR SETTINGS: Vent Mode:  [-] PRVC FiO2 (%):  [40 %] 40 % Set Rate:  [14 bmp] 14 bmp Vt Set:  [450 mL-500 mL] 500 mL PEEP:  [5 cmH20] 5 cmH20 Plateau Pressure:  [13 cmH20-15 cmH20] 14 cmH20 INTAKE / OUTPUT:  Intake/Output Summary (Last 24 hours) at 06/18/14 0746 Last data filed at 06/18/14 0700  Gross per 24 hour  Intake 3873.92 ml  Output   2250 ml  Net 1623.92 ml    PHYSICAL EXAMINATION: Gen: NAD, unresponsive HEENT: NCAT, PERRL PULM: Clear anteriorly, no wheezes CV: reg, no M AB: BS+, soft Ext: warm, no edema Neuro: RASS 0, opens eyes, localises, does not follow commands Few mins later - follows all commands, moves extremitties   LABS: I have reviewed all of today's lab results. Relevant  abnormalities are discussed in the A/P section  CXR: NAD  ASSESSMENT / PLAN:  NEUROLOGIC A:  Acute encephalopathy, unclear etiology Status epilepticus P:   RASS goal:0 Dc fent gtt, int ok Neurology managing AEDs  Daily WUA  PULMONARY ETT 8/20 >> A: Acute respiratory failure due to inability to protect airway COPD not in exacerbation P:   Cont vent bundle Daily SBTs, extubate when mental status better  CARDIOVASCULAR L IJ CVL 8/21 >>   A:   H/O hypertension P:  Monitor BP and rhythm  RENAL A:   Mild hypokalemia Lactic acidosis, resolved P:   Monitor BMET intermittently Monitor I/Os Correct K as indicated  GASTROINTESTINAL A:   No acute issues P: SUP: enteral famotidine Cont TFs  HEMATOLOGIC A:   Mild anemia without acute blood loss P:  DVT px: SQ LMWH Monitor CBC intermittently Transfuse per usual ICU guidelines  INFECTIOUS A:   Suspected Encephalitis Bacterial meningitis R/O'd by LP BCx2 8/20 >> ng UC  8/20 >> ng CSF bacterial culture 8/20 >> ng CSF HSV PCR 8/20 >>  Urine strep 8/20 >> neg P:   Vanc 8/20 >> 8/21 Ceftriaxone 8/20 >> 8/21 Ampicillin 8/20 >> 8/21 Acyclovir 8/20 >> 8/22 D/C dexamethasone  ENDOCRINE A:  DM2 P:   ICU hyperglycemia protocol  TODAY'S SUMMARY: Unclear cause of seizures ? Related to meds, remains encephalopathic, can wean to extubation once mental status improved Updated husband  I have personally obtained a history, examined the patient, evaluated  laboratory and imaging results, formulated the assessment and plan and placed orders. CRITICAL CARE: The patient is critically ill with multiple organ systems failure and requires high complexity decision making for assessment and support, frequent evaluation and titration of therapies, application of advanced monitoring technologies and extensive interpretation of multiple databases. Critical Care Time devoted to patient care services described in this note is 32  minutes.   Cyril Mourning MD. Tonny Bollman. [facility] Pager [phone] If no response call [phone]    06/18/2014, 7:46 AM

## 2014-06-18 NOTE — Procedures (Signed)
Extubation Procedure Note  Patient Details:   Name: Donna [name] DOB: 09/20/1956 MRN: [id number]   Airway Documentation:     Evaluation  O2 sats: stable throughout Complications: No apparent complications Patient did tolerate procedure well. Bilateral Breath Sounds: Clear;Diminished Suctioning: Airway Yes Placed on 4L [place] tolerating well.  Sula RumpleCampbell, Mariaclara Spear [facility] 06/18/2014, 8:29 AM

## 2014-06-18 NOTE — Progress Notes (Signed)
Wasted 60cc of Fentanyl with Alvester Morinebbie Gregory, RN

## 2014-06-18 NOTE — Progress Notes (Signed)
Subjective: No recurrence of seizure activity reported. Patient was successfully extubated earlier this morning.  Objective: Current vital signs: BP 136/80  Pulse 101  Temp(Src) 98.4 F (36.9 C) (Oral)  Resp 15  Ht 5\' 3"  (1.6 m)  Wt 100.8 kg (222 lb 3.6 oz)  BMI 39.38 kg/m2  SpO2 95%  Neurologic Exam: Patient was awake and minimally lethargic. There is well-oriented to time as well as place. She was able to follow commands well. Extraocular movements were full and conjugate. No facial weakness was noted. Speech was slightly hoarse but otherwise unremarkable. Strength and movement of extremities were was normal and symmetrical throughout.  Medications: I have reviewed the patient's current medications.  Assessment/Plan: 58 year old lady was brought to the hospital after being found unconscious and having a subsequent weakness seizure. She had low-grade fever as well as a two-day history of headache. CSF was unremarkable, with no signs of meningitis. Patient has had no recurrence of seizure activity, and has no focal deficits at this point. MRI study showed small areas of cortical and subcortical edema involving the frontal, parietal and occipital lobes bilaterally. Significance is unclear but may be manifestations seizure activity or hypertensive encephalopathy.  Recommend no changes in current management, including For 500 mg twice a day. We will continue to follow this patient with you.  C.R. Roseanne RenoStewart, MD [facility] [phone]  06/18/2014  10:26 AM

## 2014-06-18 NOTE — ED Provider Notes (Signed)
I saw and evaluated the patient, reviewed the resident's note and I agree with the findings and plan.   EKG Interpretation None     Patient with altered mental status and seizure. New onset seizure. Fever. Patient had repeat seizure in CT scan and decreased mental status. Required intubation. Admit to ICU  CRITICAL CARE Performed by: Billee CashingPICKERING,Yordy Matton R. Total critical care time: 30 Critical care time was exclusive of separately billable procedures and treating other patients. Critical care was necessary to treat or prevent imminent or life-threatening deterioration. Critical care was time spent personally by me on the following activities: development of treatment plan with patient and/or surrogate as well as nursing, discussions with consultants, evaluation of patient's response to treatment, examination of patient, obtaining history from patient or surrogate, ordering and performing treatments and interventions, ordering and review of laboratory studies, ordering and review of radiographic studies, pulse oximetry and re-evaluation of patient's condition.    Juliet RudeNathan R. Rubin PayorPickering, MD 06/18/14 [phone]

## 2014-06-19 ENCOUNTER — Inpatient Hospital Stay (HOSPITAL_COMMUNITY): Payer: 59

## 2014-06-19 LAB — GLUCOSE, CAPILLARY
Glucose-Capillary: 111 mg/dL — ABNORMAL HIGH (ref 70–99)
Glucose-Capillary: 108 mg/dL — ABNORMAL HIGH (ref 70–99)
Glucose-Capillary: 124 mg/dL — ABNORMAL HIGH (ref 70–99)
Glucose-Capillary: 152 mg/dL — ABNORMAL HIGH (ref 70–99)
Glucose-Capillary: 97 mg/dL (ref 70–99)

## 2014-06-19 LAB — BASIC METABOLIC PANEL
Anion gap: 12 (ref 5–15)
BUN: 11 mg/dL (ref 6–23)
CO2: 28 mEq/L (ref 19–32)
Calcium: 8.7 mg/dL (ref 8.4–10.5)
Chloride: 106 mEq/L (ref 96–112)
Creatinine, Ser: 0.8 mg/dL (ref 0.50–1.10)
GFR calc Af Amer: >90 mL/min (ref >90)
GFR, EST NON AFRICAN AMERICAN: 81 mL/min — AB (ref >90)
Glucose, Bld: 117 mg/dL — ABNORMAL HIGH (ref 70–99)
POTASSIUM: 3.3 meq/L — AB (ref 3.7–5.3)
SODIUM: 146 meq/L (ref 137–147)

## 2014-06-19 LAB — CBC
HCT: 31.5 % — ABNORMAL LOW (ref 36.0–46.0)
Hemoglobin: 10.3 g/dL — ABNORMAL LOW (ref 12.0–15.0)
MCH: 27.8 pg (ref 26.0–34.0)
MCHC: 32.7 g/dL (ref 30.0–36.0)
MCV: 85.1 fL (ref 78.0–100.0)
PLATELETS: 166 10*3/uL (ref 150–400)
RBC: 3.7 MIL/uL — AB (ref 3.87–5.11)
RDW: 14.4 % (ref 11.5–15.5)
WBC: 8.2 10*3/uL (ref 4.0–10.5)

## 2014-06-19 MED ORDER — PANTOPRAZOLE SODIUM 40 MG PO TBEC
40.0000 mg | DELAYED_RELEASE_TABLET | Freq: Every day | ORAL | Status: DC
  Administered 2014-06-19 – 2014-06-21 (×3): 40 mg via ORAL
  Filled 2014-06-19 (×3): qty 1

## 2014-06-19 MED ORDER — LEVETIRACETAM 500 MG PO TABS
1000.0000 mg | ORAL_TABLET | Freq: Two times a day (BID) | ORAL | Status: DC
  Administered 2014-06-19 – 2014-06-21 (×5): 1000 mg via ORAL
  Filled 2014-06-19 (×8): qty 2

## 2014-06-19 MED ORDER — POTASSIUM CHLORIDE 20 MEQ/15ML (10%) PO LIQD
40.0000 meq | Freq: Once | ORAL | Status: AC
  Administered 2014-06-19: 40 meq via ORAL
  Filled 2014-06-19: qty 30

## 2014-06-19 MED ORDER — INSULIN ASPART 100 UNIT/ML ~~LOC~~ SOLN
0.0000 [IU] | Freq: Three times a day (TID) | SUBCUTANEOUS | Status: DC
  Administered 2014-06-19: 3 [IU] via SUBCUTANEOUS
  Administered 2014-06-20: 2 [IU] via SUBCUTANEOUS
  Administered 2014-06-20 – 2014-06-21 (×2): 3 [IU] via SUBCUTANEOUS

## 2014-06-19 MED ORDER — GABAPENTIN 300 MG PO CAPS
300.0000 mg | ORAL_CAPSULE | Freq: Three times a day (TID) | ORAL | Status: DC
  Administered 2014-06-19 – 2014-06-21 (×7): 300 mg via ORAL
  Filled 2014-06-19 (×10): qty 1

## 2014-06-19 MED ORDER — OXYCODONE-ACETAMINOPHEN 5-325 MG PO TABS
1.0000 | ORAL_TABLET | Freq: Two times a day (BID) | ORAL | Status: DC | PRN
  Administered 2014-06-19 – 2014-06-21 (×2): 1 via ORAL
  Filled 2014-06-19 (×2): qty 1

## 2014-06-19 MED ORDER — DICLOFENAC SODIUM 75 MG PO TBEC
75.0000 mg | DELAYED_RELEASE_TABLET | Freq: Two times a day (BID) | ORAL | Status: DC
  Administered 2014-06-19 – 2014-06-21 (×5): 75 mg via ORAL
  Filled 2014-06-19 (×7): qty 1

## 2014-06-19 NOTE — Progress Notes (Signed)
Subjective: No recurrence of seizure activity reported, nor any adverse clinical events overnight otherwise.  Objective: Current vital signs: BP 144/79  Pulse 87  Temp(Src) 97.9 F (36.6 C) (Oral)  Resp 19  Ht  (1.6 m)  Wt 100 kg (220 lb 7.4 oz)  BMI 39.06 kg/m2  SpO2 96%  Neurologic Exam: Alert and in no acute distress. Patient is well-oriented to time as well as place. Extraocular movements were full and conjugate. Speech was normal. Patient moved extremities well with no signs of focal weakness.  Medications: I have reviewed the patient's current medications.  Assessment/Plan: Encephalopathic state with seizure activity on presentation, most likely secondary to hypertensive encephalopathy, currently resolving with no recurrence of seizure activity and return of mental status is normal.  Recommend no changes in current management including continuing Keppra 1000 mg every 12 hours. Repeat MRI is recommended for comparison with study on 06/17/2014 to reassess areas of subcortical and cortical edema. We will continue to follow this patient with you.  C.R. Roseanne Reno, MD [facility] [phone]  06/19/2014  9:40 AM

## 2014-06-19 NOTE — Progress Notes (Addendum)
PULMONARY / CRITICAL CARE MEDICINE   Name: Donna [name] MRN: [id number] DOB: 18-Aug-1956    ADMISSION DATE:  06/16/2014 CONSULTATION DATE:  06/16/2014  REFERRING MD :  Patria Mane  CHIEF COMPLAINT:  Seizure, confusion  INITIAL PRESENTATION:  58 y/o female with COPD, chronic pain syndrome admitted on 8/20 for confusion and seizures after complaining of a headache and fever.  STUDIES:  8/20 CT Head: no acute intracranial process 8/20 LP: negative (1-3 WBC/cubic mm) 8/21 MRI brain: Small areas of symmetric cortical and subcortical edema in the frontal, parietal and occipital lobes bilaterally - hypertensive encephalopathy vs Seizure   SIGNIFICANT EVENTS: 8/20 Neuro Consult: empiric coverage for meningitis, HSV encephalitis initiated. Keppra initiated  SUBJECTIVE:  Did well post extubation C/o mild back pain afebrile   VITAL SIGNS: Temp:  [97.7 F (36.5 C)-99.7 F (37.6 C)] 97.9 F (36.6 C) (08/23 0730) Pulse Rate:  [73-106] 79 (08/23 0600) Resp:  [10-28] 13 (08/23 0600) BP: (126-171)/(72-94) 144/77 mmHg (08/23 0400) SpO2:  [89 %-100 %] 100 % (08/23 0746) FiO2 (%):  [28 %] 28 % (08/23 0746) Weight:  [100 kg (220 lb 7.4 oz)] 100 kg (220 lb 7.4 oz) (08/23 0500) HEMODYNAMICS:   VENTILATOR SETTINGS: Vent Mode:  [-]  FiO2 (%):  [28 %] 28 % INTAKE / OUTPUT:  Intake/Output Summary (Last 24 hours) at 06/19/14 0758 Last data filed at 06/19/14 0600  Gross per 24 hour  Intake   1340 ml  Output   3375 ml  Net  -2035 ml    PHYSICAL EXAMINATION: Gen: NAD, unresponsive HEENT: NCAT, PERRL PULM: Clear anteriorly, no wheezes CV: reg, no M AB: BS+, soft Ext: warm, no edema Neuro: RASS 0, interactive, follows commands Few mins later - follows all commands, moves extremitties   LABS: I have reviewed all of today's lab results. Relevant abnormalities are discussed in the A/P section  CXR: NAD  ASSESSMENT / PLAN:  NEUROLOGIC A:  Acute encephalopathy, unclear etiology Status  epilepticus P:   RASS goal:0 Dc fent gtt, int ok Per Neurology - ct keppra Resume diclofenac, oxycodone, neurontin under observation, hold tramadol   PULMONARY ETT 8/20 >>8/22 A: Acute respiratory failure due to inability to protect airway COPD not in exacerbation P:   nebs  CARDIOVASCULAR L IJ CVL 8/21 >>   A:   H/O hypertension P:  Monitor BP and rhythm Dc CVL  RENAL A:   Mild hypokalemia Lactic acidosis, resolved P:   Monitor BMET intermittently Monitor I/Os Correct K as indicated  GASTROINTESTINAL A:   No acute issues P: Advance PO  HEMATOLOGIC A:   Mild anemia without acute blood loss P:  DVT px: SQ LMWH Monitor CBC intermittently Transfuse per usual ICU guidelines  INFECTIOUS A:   Suspected Encephalitis Bacterial meningitis R/O'd by LP BCx2 8/20 >> ng UC  8/20 >> ng CSF bacterial culture 8/20 >> ng CSF HSV PCR 8/20 >>  Urine strep 8/20 >> neg P:   Vanc 8/20 >> 8/21 Ceftriaxone 8/20 >> 8/21 Ampicillin 8/20 >> 8/21 Acyclovir 8/20 >> 8/22 D/C dexamethasone  ENDOCRINE A:  DM2 P:   Dc ICU hyperglycemia protocol SSI -  TODAY'S SUMMARY: Unclear cause of seizures ? Related to meds, much improved, transfer to floor Updated husband  I have personally obtained a history, examined the patient, evaluated laboratory and imaging results, formulated the assessment and plan and placed orders.   Cyril Mourning MD. Tonny Bollman. [facility] Pager [phone] If no response call [phone]    06/19/2014, 7:58 AM

## 2014-06-20 ENCOUNTER — Inpatient Hospital Stay (HOSPITAL_COMMUNITY): Payer: 59

## 2014-06-20 DIAGNOSIS — J449 Chronic obstructive pulmonary disease, unspecified: Secondary | ICD-10-CM

## 2014-06-20 LAB — BASIC METABOLIC PANEL
Anion gap: 15 (ref 5–15)
BUN: 14 mg/dL (ref 6–23)
CALCIUM: 9.1 mg/dL (ref 8.4–10.5)
CO2: 24 meq/L (ref 19–32)
Chloride: 101 mEq/L (ref 96–112)
Creatinine, Ser: 0.86 mg/dL (ref 0.50–1.10)
GFR calc Af Amer: 86 mL/min — ABNORMAL LOW (ref >90)
GFR, EST NON AFRICAN AMERICAN: 74 mL/min — AB (ref >90)
GLUCOSE: 115 mg/dL — AB (ref 70–99)
Potassium: 3.8 mEq/L (ref 3.7–5.3)
SODIUM: 140 meq/L (ref 137–147)

## 2014-06-20 LAB — CSF CULTURE W GRAM STAIN

## 2014-06-20 LAB — GLUCOSE, CAPILLARY
GLUCOSE-CAPILLARY: 126 mg/dL — AB (ref 70–99)
GLUCOSE-CAPILLARY: 108 mg/dL — AB (ref 70–99)
GLUCOSE-CAPILLARY: 163 mg/dL — AB (ref 70–99)
Glucose-Capillary: 130 mg/dL — ABNORMAL HIGH (ref 70–99)

## 2014-06-20 LAB — CSF CULTURE: CULTURE: NO GROWTH

## 2014-06-20 MED ORDER — LISINOPRIL 5 MG PO TABS
5.0000 mg | ORAL_TABLET | Freq: Every day | ORAL | Status: DC
  Administered 2014-06-20 – 2014-06-21 (×2): 5 mg via ORAL
  Filled 2014-06-20 (×2): qty 1

## 2014-06-20 MED ORDER — HYDRALAZINE HCL 20 MG/ML IJ SOLN: 10.0000 mg | Freq: Three times a day (TID) | INTRAMUSCULAR | Status: DC | PRN

## 2014-06-20 MED ORDER — AMLODIPINE BESYLATE 5 MG PO TABS
5.0000 mg | ORAL_TABLET | Freq: Every day | ORAL | Status: DC
  Administered 2014-06-20 – 2014-06-21 (×2): 5 mg via ORAL
  Filled 2014-06-20 (×2): qty 1

## 2014-06-20 NOTE — Progress Notes (Signed)
[facility] PROGRESS NOTE  Donna [name] ZOX:[id number] DOB: Feb 24, 1956 DOA: 06/16/2014 PCP: Elizabeth Palau, FNP  Assessment/Plan: 58 y/o female with PMH of HTN, GERD, HPL, COPD, DM, chronic pain, anxiety brought in by EMS for being found unresponsive with seizure activity    1. New oncet seizure; MRI: cortical and subcortical edema in the frontal, parietal and occipital lobes bilaterally ? Related to seizure vs HTN encephalopathy  -started on Keppra 1000 mg every 12 hours; no new episodes  -repeat MRI per neurology; d/c tramadol   2. Acute encephalopathy likely due to combination of sezireu +HTN encephalopathy -resolved' as above 3. Acute respiratory failure due to inability to protect airway in the setting of seizure  -resolved  4. COPD, stable; no wheezing; cont prn inhalers 5.HTN, uncontrolled; with HTN encephalopathy  -started amlodipine,, lisinopril; prn hydralazine; titrate per response  6. DM cont home regimen upon d/c        8/20 CT Head: no acute intracranial process  8/20 LP: negative (1-3 WBC/cubic mm)  8/21 MRI brain: Small areas of symmetric cortical and subcortical edema in the frontal, parietal and occipital lobes bilaterally - hypertensive encephalopathy vs Seizure  BCx2 8/20 >> ng  UC 8/20 >> ng  CSF bacterial culture 8/20 >> ng  CSF HSV PCR 8/20 >>  Urine strep 8/20 >> neg  P:  Vanc 8/20 >> 8/21  Ceftriaxone 8/20 >> 8/21  Ampicillin 8/20 >> 8/21  Acyclovir 8/20 >> 8/22  D/C dexamethasone  Code Status: full Family Communication:  D/w patient, husband (indicate person spoken with, relationship, and if by phone, the number) Disposition Plan: home 24-48 hrs    Consultants:  neurology  Procedures:    Antibiotics:   (indicate start date, and stop date if known)  HPI/Subjective: alert  Objective: Filed Vitals:   06/20/14 0609  BP: 165/93  Pulse: 78  Temp: 98.1 F (36.7 C)  Resp: 18    Intake/Output Summary (Last 24 hours) at  06/20/14 1029 Last data filed at 06/20/14 0906  Gross per 24 hour  Intake    822 ml  Output      3 ml  Net    819 ml   Filed Weights   06/18/14 0418 06/19/14 0500 06/20/14 0609  Weight: 100.8 kg (222 lb 3.6 oz) 100 kg (220 lb 7.4 oz) 101.379 kg (223 lb 8 oz)    Exam:   General:  alert  Cardiovascular: s1,s2 rrr  Respiratory: CTA BL  Abdomen: soft, nt,nd   Musculoskeletal: no LE edema   Data Reviewed: Basic Metabolic Panel:  Recent Labs Lab 06/16/14 1654 06/16/14 1703 06/17/14 0630 06/18/14 0416 06/19/14 0500 06/20/14 0606  NA 140 137 139 145 146 140  K 4.4 4.2 3.3* 3.3* 3.3* 3.8  CL 97 99 101 106 106 101  CO2 22  --  GLUCOSE 199* 202* 195* 195* 117* 115*  BUN CREATININE 1.02 1.00 0.98 0.82 0.80 0.86  CALCIUM 10.1  --  8.6 8.2* 8.7 9.1   Liver Function Tests:  Recent Labs Lab 06/16/14 1654  AST 44*  ALT 33  ALKPHOS 96  BILITOT 0.3  PROT 7.8  ALBUMIN 4.3   No results found for this basename: LIPASE, AMYLASE,  in the last 168 hours No results found for this basename: AMMONIA,  in the last 168 hours CBC:  Recent Labs Lab 06/16/14 1654 06/16/14 1703 06/17/14 0630 06/18/14 0416 06/19/14 0500  WBC 19.3*  --  11.3* 9.8 8.2  NEUTROABS 17.5*  --   --   --   --   HGB 12.8 14.6 10.6* 9.5* 10.3*  HCT 38.5 43.0 32.2* 29.4* 31.5*  MCV 83.5  --  84.3 84.7 85.1  PLT 275  --  220 185 166   Cardiac Enzymes: No results found for this basename: CKTOTAL, CKMB, CKMBINDEX, TROPONINI,  in the last 168 hours BNP (last 3 results) No results found for this basename: PROBNP,  in the last 8760 hours CBG:  Recent Labs Lab 06/19/14 0729 06/19/14 1139 06/19/14 1833 06/19/14 2131 06/20/14 0801  GLUCAP 108* 124* 152* 97 126*    Recent Results (from the past 240 hour(s))  URINE CULTURE     Status: None   Collection Time    06/16/14  3:34 PM      Result Value Ref Range Status   Specimen Description URINE, CATHETERIZED   Final    Special Requests NONE   Final   Culture  Setup Time     Final   Value: 06/16/2014 22:29     Performed at [facility] Count     Final   Value: NO GROWTH     Performed at [facility]   Culture     Final   Value: NO GROWTH     Performed at [facility]   Report Status 06/17/2014 FINAL   Final  CULTURE, BLOOD (ROUTINE X 2)     Status: None   Collection Time    06/16/14  4:44 PM      Result Value Ref Range Status   Specimen Description BLOOD ARM LEFT   Final   Special Requests BOTTLES DRAWN AEROBIC AND ANAEROBIC 10CC   Final   Culture  Setup Time     Final   Value: 06/16/2014 22:41     Performed at [facility]   Culture     Final   Value:        BLOOD CULTURE RECEIVED NO GROWTH TO DATE CULTURE WILL BE HELD FOR 5 DAYS BEFORE ISSUING A FINAL NEGATIVE REPORT     Performed at [facility]   Report Status PENDING   Incomplete  CULTURE, BLOOD (ROUTINE X 2)     Status: None   Collection Time    06/16/14  4:54 PM      Result Value Ref Range Status   Specimen Description BLOOD HAND LEFT   Final   Special Requests BOTTLES DRAWN AEROBIC ONLY 5CC   Final   Culture  Setup Time     Final   Value: 06/16/2014 22:39     Performed at [facility]   Culture     Final   Value:        BLOOD CULTURE RECEIVED NO GROWTH TO DATE CULTURE WILL BE HELD FOR 5 DAYS BEFORE ISSUING A FINAL NEGATIVE REPORT     Performed at [facility]   Report Status PENDING   Incomplete  CSF CULTURE     Status: None   Collection Time    06/16/14  8:20 PM      Result Value Ref Range Status   Specimen Description CSF   Final   Special Requests NO 2 2.5CC   Final   Gram Stain     Final   Value: WBC PRESENT,BOTH PMN AND MONONUCLEAR     NO ORGANISMS SEEN     CYTOSPIN Performed at [facility]  Performed at [facility]     Final   Value: NO GROWTH 3 DAYS     Performed at [facility]   Report Status 06/20/2014 FINAL    Final  GRAM STAIN     Status: None   Collection Time    06/16/14  8:20 PM      Result Value Ref Range Status   Specimen Description CSF   Final   Special Requests NO 2 2.5CC   Final   Gram Stain     Final   Value: WBC PRESENT,BOTH PMN AND MONONUCLEAR     NO ORGANISMS SEEN     CYTOSPIN SLIDE   Report Status 06/16/2014 FINAL   Final  MRSA PCR SCREENING     Status: None   Collection Time    06/16/14  9:35 PM      Result Value Ref Range Status   MRSA by PCR NEGATIVE  NEGATIVE Final   Comment:            The GeneXpert MRSA Assay (FDA     approved for NASAL specimens     only), is one component of a     comprehensive MRSA colonization     surveillance program. It is not     intended to diagnose MRSA     infection nor to guide or     monitor treatment for     MRSA infections.     Studies: Dg Chest Port 1 View  06/19/2014   CLINICAL DATA:  Endotracheal tube removal.  EXAM: PORTABLE CHEST - 1 VIEW  COMPARISON:  June 18, 2014.  FINDINGS: The heart size and mediastinal contours are within normal limits. Endotracheal tube has been removed. Nasogastric tube has been removed. Left internal jugular catheter remains with distal tip in expected position of the SVC. No pneumothorax or pleural effusion is noted. Both lungs are clear. The visualized skeletal structures are unremarkable.  IMPRESSION: Endotracheal and nasogastric tubes have been removed. Stable left internal jugular venous catheter. No acute cardiopulmonary abnormality seen.   Electronically Signed   By: Roque Lias M.D.   On: 06/19/2014 07:56    Scheduled Meds: . diclofenac  75 mg Oral BID WC  . gabapentin  300 mg Oral TID  . heparin subcutaneous  5,000 Units Subcutaneous 3 times per day  . insulin aspart  0-15 Units Subcutaneous TID WC  . ipratropium-albuterol  3 mL Nebulization Q6H  . levETIRAcetam  1,000 mg Oral BID  . levothyroxine  100 mcg Oral QAC breakfast  . pantoprazole  40 mg Oral Daily  . simvastatin  5 mg Oral  q1800   Continuous Infusions:   Principal Problem:   Status epilepticus Active Problems:   COPD (chronic obstructive pulmonary disease)   Acute respiratory failure with hypercapnia   Acute encephalopathy   Meningitis    Time spent: >35 minutes     Esperanza Sheets  [facility] Pager [phone]. If 7PM-7AM, please contact night-coverage at [email or web address], password [facility] 06/20/2014, 10:29 AM  LOS: 4 days

## 2014-06-20 NOTE — Progress Notes (Signed)
Subjective: No complaints.  Continues to feel better,  No further seizures. Tolerating Keppra well.   Objective: Current vital signs: BP 171/103  Pulse 85  Temp(Src) 98.8 F (37.1 C) (Oral)  Resp 19  Ht  (1.6 m)  Wt 101.379 kg (223 lb 8 oz)  BMI 39.60 kg/m2  SpO2 95% Vital signs in last 24 hours: Temp:  [98.1 F (36.7 C)-98.8 F (37.1 C)] 98.8 F (37.1 C) (08/24 1051) Pulse Rate:  [78-85] 85 (08/24 1051) Resp:  [18-20] 19 (08/24 1051) BP: (160-171)/(75-103) 171/103 mmHg (08/24 1051) SpO2:  [95 %-98 %] 95 % (08/24 1051) Weight:  [101.379 kg (223 lb 8 oz)] 101.379 kg (223 lb 8 oz) (08/24 0609)  Intake/Output from previous day: 08/23 0701 - 08/24 0700 In: 720 [P.O.:120; I.V.:600] Out: 2 [Urine:1; Stool:1] Intake/Output this shift: Total I/O In: 222 [P.O.:222] Out: 1 [Urine:1] Nutritional status: Carb Control  Neurologic Exam: General: NAD Mental Status: Alert, oriented, thought content appropriate.  Speech fluent without evidence of aphasia.  Able to follow 3 step commands without difficulty. Cranial Nerves: II: Discs flat bilaterally; Visual fields grossly normal, pupils equal, round, reactive to light and accommodation III,IV, VI: ptosis not present, extra-ocular motions intact bilaterally V,VII: smile symmetric, facial light touch sensation normal bilaterally VIII: hearing normal bilaterally IX,X: gag reflex present XI: bilateral shoulder shrug XII: midline tongue extension without atrophy or fasciculations  Motor: Right : Upper extremity   5/5    Left:     Upper extremity   5/5  Lower extremity   5/5     Lower extremity   5/5 Tone and bulk:normal tone throughout; no atrophy noted Sensory: Pinprick and light touch intact throughout, bilaterally Deep Tendon Reflexes:  Right: Upper Extremity   Left: Upper extremity   biceps (C-5 to C-6) 2/4   biceps (C-5 to C-6) 2/4 tricep (C7) 2/4    triceps (C7) 2/4 Brachioradialis (C6) 2/4  Brachioradialis (C6)  2/4  Lower Extremity Lower Extremity  quadriceps (L-2 to L-4) 2/4   quadriceps (L-2 to L-4) 2/4 Achilles (S1) 1/4   Achilles (S1) 1/4  Plantars: Right: downgoing   Left: downgoing Cerebellar: normal finger-to-nose,  normal heel-to-shin test     Lab Results: Basic Metabolic Panel:  Recent Labs Lab 06/16/14 1654 06/16/14 1703 06/17/14 0630 06/18/14 0416 06/19/14 0500 06/20/14 0606  NA 140 137 139 145 146 140  K 4.4 4.2 3.3* 3.3* 3.3* 3.8  CL 97 99 101 106 106 101  CO2 22  --  GLUCOSE 199* 202* 195* 195* 117* 115*  BUN CREATININE 1.02 1.00 0.98 0.82 0.80 0.86  CALCIUM 10.1  --  8.6 8.2* 8.7 9.1    Liver Function Tests:  Recent Labs Lab 06/16/14 1654  AST 44*  ALT 33  ALKPHOS 96  BILITOT 0.3  PROT 7.8  ALBUMIN 4.3   No results found for this basename: LIPASE, AMYLASE,  in the last 168 hours No results found for this basename: AMMONIA,  in the last 168 hours  CBC:  Recent Labs Lab 06/16/14 1654 06/16/14 1703 06/17/14 0630 06/18/14 0416 06/19/14 0500  WBC 19.3*  --  11.3* 9.8 8.2  NEUTROABS 17.5*  --   --   --   --   HGB 12.8 14.6 10.6* 9.5* 10.3*  HCT 38.5 43.0 32.2* 29.4* 31.5*  MCV 83.5  --  84.3 84.7 85.1  PLT 275  --  220 185 166  Cardiac Enzymes: No results found for this basename: CKTOTAL, CKMB, CKMBINDEX, TROPONINI,  in the last 168 hours  Lipid Panel:  Recent Labs Lab 06/18/14 0416  TRIG 125    CBG:  Recent Labs Lab 06/19/14 1139 06/19/14 1833 06/19/14 2131 06/20/14 0801 06/20/14 1147  GLUCAP 124* 152* 97 126* 108*    Microbiology: Results for orders placed during the hospital encounter of 06/16/14  URINE CULTURE     Status: None   Collection Time    06/16/14  3:34 PM      Result Value Ref Range Status   Specimen Description URINE, CATHETERIZED   Final   Special Requests NONE   Final   Culture  Setup Time     Final   Value: 06/16/2014 22:29     Performed at [facility] Count     Final   Value: NO GROWTH     Performed at [facility]   Culture     Final   Value: NO GROWTH     Performed at [facility]   Report Status 06/17/2014 FINAL   Final  CULTURE, BLOOD (ROUTINE X 2)     Status: None   Collection Time    06/16/14  4:44 PM      Result Value Ref Range Status   Specimen Description BLOOD ARM LEFT   Final   Special Requests BOTTLES DRAWN AEROBIC AND ANAEROBIC 10CC   Final   Culture  Setup Time     Final   Value: 06/16/2014 22:41     Performed at [facility]   Culture     Final   Value:        BLOOD CULTURE RECEIVED NO GROWTH TO DATE CULTURE WILL BE HELD FOR 5 DAYS BEFORE ISSUING A FINAL NEGATIVE REPORT     Performed at [facility]   Report Status PENDING   Incomplete  CULTURE, BLOOD (ROUTINE X 2)     Status: None   Collection Time    06/16/14  4:54 PM      Result Value Ref Range Status   Specimen Description BLOOD HAND LEFT   Final   Special Requests BOTTLES DRAWN AEROBIC ONLY 5CC   Final   Culture  Setup Time     Final   Value: 06/16/2014 22:39     Performed at [facility]   Culture     Final   Value:        BLOOD CULTURE RECEIVED NO GROWTH TO DATE CULTURE WILL BE HELD FOR 5 DAYS BEFORE ISSUING A FINAL NEGATIVE REPORT     Performed at [facility]   Report Status PENDING   Incomplete  CSF CULTURE     Status: None   Collection Time    06/16/14  8:20 PM      Result Value Ref Range Status   Specimen Description CSF   Final   Special Requests NO 2 2.5CC   Final   Gram Stain     Final   Value: WBC PRESENT,BOTH PMN AND MONONUCLEAR     NO ORGANISMS SEEN     CYTOSPIN Performed at [facility]     Performed at [facility]   Culture     Final   Value: NO GROWTH 3 DAYS     Performed at [facility]   Report Status 06/20/2014 FINAL   Final  GRAM STAIN     Status: None  Collection Time    06/16/14  8:20 PM      Result Value Ref Range Status   Specimen  Description CSF   Final   Special Requests NO 2 2.5CC   Final   Gram Stain     Final   Value: WBC PRESENT,BOTH PMN AND MONONUCLEAR     NO ORGANISMS SEEN     CYTOSPIN SLIDE   Report Status 06/16/2014 FINAL   Final  MRSA PCR SCREENING     Status: None   Collection Time    06/16/14  9:35 PM      Result Value Ref Range Status   MRSA by PCR NEGATIVE  NEGATIVE Final   Comment:            The GeneXpert MRSA Assay (FDA     approved for NASAL specimens     only), is one component of a     comprehensive MRSA colonization     surveillance program. It is not     intended to diagnose MRSA     infection nor to guide or     monitor treatment for     MRSA infections.    Coagulation Studies: No results found for this basename: LABPROT, INR,  in the last 72 hours  Imaging: Dg Chest Port 1 View  06/19/2014   CLINICAL DATA:  Endotracheal tube removal.  EXAM: PORTABLE CHEST - 1 VIEW  COMPARISON:  June 18, 2014.  FINDINGS: The heart size and mediastinal contours are within normal limits. Endotracheal tube has been removed. Nasogastric tube has been removed. Left internal jugular catheter remains with distal tip in expected position of the SVC. No pneumothorax or pleural effusion is noted. Both lungs are clear. The visualized skeletal structures are unremarkable.  IMPRESSION: Endotracheal and nasogastric tubes have been removed. Stable left internal jugular venous catheter. No acute cardiopulmonary abnormality seen.   Electronically Signed   By: Roque Lias M.D.   On: 06/19/2014 07:56    Medications:  Scheduled: . amLODipine  5 mg Oral Daily  . diclofenac  75 mg Oral BID WC  . gabapentin  300 mg Oral TID  . heparin subcutaneous  5,000 Units Subcutaneous 3 times per day  . insulin aspart  0-15 Units Subcutaneous TID WC  . ipratropium-albuterol  3 mL Nebulization Q6H  . levETIRAcetam  1,000 mg Oral BID  . levothyroxine  100 mcg Oral QAC breakfast  . lisinopril  5 mg Oral Daily  . pantoprazole   40 mg Oral Daily  . simvastatin  5 mg Oral q1800    Assessment/Plan: Encephalopathic state with seizure activity on presentation with continued resolution and improvement.  Home Tramadol has been stopped and BP controlled. Recommend no changes in current management including continuing Keppra 1000 mg every 12 hours Repeat MRI scheduled f or comparison with study on 06/17/2014 to reassess areas of subcortical and cortical edema .   Will continue to follow.     Felicie Morn PA-C [facility] [phone]  06/20/2014, 12:19 PM

## 2014-06-21 DIAGNOSIS — J42 Unspecified chronic bronchitis: Secondary | ICD-10-CM

## 2014-06-21 DIAGNOSIS — E119 Type 2 diabetes mellitus without complications: Secondary | ICD-10-CM

## 2014-06-21 LAB — GLUCOSE, CAPILLARY: GLUCOSE-CAPILLARY: 151 mg/dL — AB (ref 70–99)

## 2014-06-21 MED ORDER — LEVETIRACETAM 1000 MG PO TABS: 1000.0000 mg | ORAL_TABLET | Freq: Two times a day (BID) | ORAL | Status: AC

## 2014-06-21 MED ORDER — OXYCODONE-ACETAMINOPHEN 5-325 MG PO TABS: 1.0000 | ORAL_TABLET | Freq: Two times a day (BID) | ORAL | Status: DC | PRN

## 2014-06-21 MED ORDER — AMLODIPINE BESYLATE 5 MG PO TABS: 5.0000 mg | ORAL_TABLET | Freq: Every day | ORAL | Status: AC

## 2014-06-21 MED ORDER — LISINOPRIL 5 MG PO TABS: 5.0000 mg | ORAL_TABLET | Freq: Every day | ORAL | Status: AC

## 2014-06-21 NOTE — Progress Notes (Signed)
Discussed discharge summary with patient. Reviewed all medications with patient. Patient received Rx. Patient did not have any questions. Patient ready for discharge. 

## 2014-06-21 NOTE — Discharge Instructions (Signed)
Please follow up with neurology in 2-3 weeks Please follow up with PCP in 1 week

## 2014-06-21 NOTE — Progress Notes (Signed)
Subjective: No further seizures. Feeling well. Wanting to go home.   Objective: Current vital signs: BP 142/83  Pulse 82  Temp(Src) 98.3 F (36.8 C) (Oral)  Resp 15  Ht  (1.6 m)  Wt 102.241 kg (225 lb 6.4 oz)  BMI 39.94 kg/m2  SpO2 95% Vital signs in last 24 hours: Temp:  [98.1 F (36.7 C)-98.8 F (37.1 C)] 98.3 F (36.8 C) (08/25 0646) Pulse Rate:  [79-86] 82 (08/25 0646) Resp:  [15-19] 15 (08/25 0646) BP: (142-171)/(70-103) 142/83 mmHg (08/25 0646) SpO2:  [94 %-99 %] 95 % (08/25 0916) Weight:  [102.241 kg (225 lb 6.4 oz)] 102.241 kg (225 lb 6.4 oz) (08/25 0500)  Intake/Output from previous day: 08/24 0701 - 08/25 0700 In: 462 [P.O.:462] Out: 5 [Urine:5] Intake/Output this shift:   Nutritional status: Carb Control  Neurologic Exam: General: Mental Status: Alert, oriented, thought content appropriate.  Speech fluent without evidence of aphasia.  Able to follow 3 step commands without difficulty. Cranial Nerves: II: Visual fields grossly normal, pupils equal, round, reactive to light and accommodation III,IV, VI: ptosis not present, extra-ocular motions intact bilaterally V,VII: smile symmetric, facial light touch sensation normal bilaterally VIII: hearing normal bilaterally IX,X: gag reflex present XI: bilateral shoulder shrug XII: midline tongue extension without atrophy or fasciculations  Motor: Right : Upper extremity   5/5    Left:     Upper extremity   5/5  Lower extremity   5/5     Lower extremity   5/5 Tone and bulk:normal tone throughout; no atrophy noted Sensory: Pinprick and light touch intact throughout, bilaterally Deep Tendon Reflexes:  Right: Upper Extremity   Left: Upper extremity   biceps (C-5 to C-6) 2/4   biceps (C-5 to C-6) 2/4 tricep (C7) 2/4    triceps (C7) 2/4 Brachioradialis (C6) 2/4  Brachioradialis (C6) 2/4  Lower Extremity Lower Extremity  quadriceps (L-2 to L-4) 2/4   quadriceps (L-2 to L-4) 2/4 Achilles (S1) 1/4   Achilles  (S1) 1/4  Plantars: Right: downgoing   Left: downgoing    Lab Results: Basic Metabolic Panel:  Recent Labs Lab 06/16/14 1654 06/16/14 1703 06/17/14 0630 06/18/14 0416 06/19/14 0500 06/20/14 0606  NA 140 137 139 145 146 140  K 4.4 4.2 3.3* 3.3* 3.3* 3.8  CL 97 99 101 106 106 101  CO2 22  --  GLUCOSE 199* 202* 195* 195* 117* 115*  BUN CREATININE 1.02 1.00 0.98 0.82 0.80 0.86  CALCIUM 10.1  --  8.6 8.2* 8.7 9.1    Liver Function Tests:  Recent Labs Lab 06/16/14 1654  AST 44*  ALT 33  ALKPHOS 96  BILITOT 0.3  PROT 7.8  ALBUMIN 4.3   No results found for this basename: LIPASE, AMYLASE,  in the last 168 hours No results found for this basename: AMMONIA,  in the last 168 hours  CBC:  Recent Labs Lab 06/16/14 1654 06/16/14 1703 06/17/14 0630 06/18/14 0416 06/19/14 0500  WBC 19.3*  --  11.3* 9.8 8.2  NEUTROABS 17.5*  --   --   --   --   HGB 12.8 14.6 10.6* 9.5* 10.3*  HCT 38.5 43.0 32.2* 29.4* 31.5*  MCV 83.5  --  84.3 84.7 85.1  PLT 275  --  220 185 166    Cardiac Enzymes: No results found for this basename: CKTOTAL, CKMB, CKMBINDEX, TROPONINI,  in the last 168 hours  Lipid Panel:  Recent Labs Lab 06/18/14 0416  TRIG 125    CBG:  Recent Labs Lab 06/20/14 0801 06/20/14 1147 06/20/14 1718 06/20/14 2244 06/21/14 0800  GLUCAP 126* 108* 163* 130* 151*    Microbiology: Results for orders placed during the hospital encounter of 06/16/14  URINE CULTURE     Status: None   Collection Time    06/16/14  3:34 PM      Result Value Ref Range Status   Specimen Description URINE, CATHETERIZED   Final   Special Requests NONE   Final   Culture  Setup Time     Final   Value: 06/16/2014 22:29     Performed at [facility] Count     Final   Value: NO GROWTH     Performed at [facility]   Culture     Final   Value: NO GROWTH     Performed at [facility]   Report Status 06/17/2014  FINAL   Final  CULTURE, BLOOD (ROUTINE X 2)     Status: None   Collection Time    06/16/14  4:44 PM      Result Value Ref Range Status   Specimen Description BLOOD ARM LEFT   Final   Special Requests BOTTLES DRAWN AEROBIC AND ANAEROBIC 10CC   Final   Culture  Setup Time     Final   Value: 06/16/2014 22:41     Performed at [facility]   Culture     Final   Value:        BLOOD CULTURE RECEIVED NO GROWTH TO DATE CULTURE WILL BE HELD FOR 5 DAYS BEFORE ISSUING A FINAL NEGATIVE REPORT     Performed at [facility]   Report Status PENDING   Incomplete  CULTURE, BLOOD (ROUTINE X 2)     Status: None   Collection Time    06/16/14  4:54 PM      Result Value Ref Range Status   Specimen Description BLOOD HAND LEFT   Final   Special Requests BOTTLES DRAWN AEROBIC ONLY 5CC   Final   Culture  Setup Time     Final   Value: 06/16/2014 22:39     Performed at [facility]   Culture     Final   Value:        BLOOD CULTURE RECEIVED NO GROWTH TO DATE CULTURE WILL BE HELD FOR 5 DAYS BEFORE ISSUING A FINAL NEGATIVE REPORT     Performed at [facility]   Report Status PENDING   Incomplete  CSF CULTURE     Status: None   Collection Time    06/16/14  8:20 PM      Result Value Ref Range Status   Specimen Description CSF   Final   Special Requests NO 2 2.5CC   Final   Gram Stain     Final   Value: WBC PRESENT,BOTH PMN AND MONONUCLEAR     NO ORGANISMS SEEN     CYTOSPIN Performed at [facility]     Performed at [facility]   Culture     Final   Value: NO GROWTH 3 DAYS     Performed at [facility]   Report Status 06/20/2014 FINAL   Final  GRAM STAIN     Status: None   Collection Time    06/16/14  8:20 PM      Result Value Ref Range Status  Specimen Description CSF   Final   Special Requests NO 2 2.5CC   Final   Gram Stain     Final   Value: WBC PRESENT,BOTH PMN AND MONONUCLEAR     NO ORGANISMS SEEN     CYTOSPIN SLIDE   Report Status  06/16/2014 FINAL   Final  MRSA PCR SCREENING     Status: None   Collection Time    06/16/14  9:35 PM      Result Value Ref Range Status   MRSA by PCR NEGATIVE  NEGATIVE Final   Comment:            The GeneXpert MRSA Assay (FDA     approved for NASAL specimens     only), is one component of a     comprehensive MRSA colonization     surveillance program. It is not     intended to diagnose MRSA     infection nor to guide or     monitor treatment for     MRSA infections.    Coagulation Studies: No results found for this basename: LABPROT, INR,  in the last 72 hours  Imaging: Mr Brain Wo Contrast  06/20/2014   CLINICAL DATA:  Admitted unresponsive with seizures, now controlled. Improving mental status. Unremarkable CSF. History of hypertension.  EXAM: MRI HEAD WITHOUT CONTRAST  TECHNIQUE: Multiplanar, multiecho pulse sequences of the brain and surrounding structures were obtained without intravenous contrast.  COMPARISON:  MR brain 06/17/2014.  CT head 06/16/14.  FINDINGS: No restricted diffusion or acute hemorrhage. No mass lesion or hydrocephalus. No extra-axial fluid. Normal cerebral volume. No temporal lobe edema or hemorrhage to suggest herpetic encephalitis.  Premature for age cerebral and cerebellar atrophy. Mild periventricular and punctate subcortical white matter signal abnormality, likely superimposed small vessel disease.  There is persistent subcortical white matter vasogenic type edema in the BILATERAL LEFT greater than RIGHT occipital, posterior parietal, and LEFT posterior frontal regions. No cerebellar or brainstem involvement. No hemorrhagic transformation. Tiny focus of RIGHT cerebellar microhemorrhage better visualized on the previous study. Flow voids are maintained.  Moderate RIGHT mastoid effusion, likely due to recumbency. Small air-fluid level RIGHT maxillary sinus is improved. No upper cervical lesions. Partial empty sella.  IMPRESSION: The persistent, predominantly  posterior circulation vasogenic subcortical edema without restricted diffusion is not significantly improved from 06/17/2014. Correlate clinically for possible PRES. Postictal phenomenon not excluded but less favored.  No evidence for herpetic encephalitis.   Electronically Signed   By: Davonna Belling M.D.   On: 06/20/2014 20:21    Medications:  Scheduled: . amLODipine  5 mg Oral Daily  . diclofenac  75 mg Oral BID WC  . gabapentin  300 mg Oral TID  . heparin subcutaneous  5,000 Units Subcutaneous 3 times per day  . insulin aspart  0-15 Units Subcutaneous TID WC  . ipratropium-albuterol  3 mL Nebulization Q6H  . levETIRAcetam  1,000 mg Oral BID  . levothyroxine  100 mcg Oral QAC breakfast  . lisinopril  5 mg Oral Daily  . pantoprazole  40 mg Oral Daily  . simvastatin  5 mg Oral q1800    Assessment/Plan: Encephalopathic state with seizure activity on presentation with continued resolution and improvement. Home Tramadol has been stopped and BP controlled. 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    Recommend  1) Continue BP control 2) Continuing Keppra 1000 mg every 12 hours 3) Follow up with out patient neurology 4) No driving,  operating heavy machinery, perform activities at heights, swimming or participation in water activities until release by outpatient physician.  This has been discussed with patient.    Neurology S/O   Felicie Morn PA-C [facility] [phone]  06/21/2014, 10:20 AM

## 2014-06-21 NOTE — Discharge Summary (Signed)
Physician Discharge Summary  ISLAND DOHMEN ZOX:[id number] DOB: 1956-03-13 DOA: 06/16/2014  PCP: Elizabeth Palau, FNP  Admit date: 06/16/2014 Discharge date: 06/21/2014  Time spent: >35nutes  Recommendations for Outpatient Follow-up:  F/u  With neurology in 2-3 weeks F/u with PCP in 1 week   Discharge Diagnoses:  Principal Problem:   Status epilepticus Active Problems:   COPD (chronic obstructive pulmonary disease)   Acute respiratory failure with hypercapnia   Acute encephalopathy   Meningitis   Discharge Condition: stable   Diet recommendation: DM,low sodium  Filed Weights   06/19/14 0500 06/20/14 0609 06/21/14 0500  Weight: 100 kg (220 lb 7.4 oz) 101.379 kg (223 lb 8 oz) 102.241 kg (225 lb 6.4 oz)    History of present illness:  58 y/o female with PMH of HTN, GERD, HPL, COPD, DM, chronic pain, anxiety brought in by EMS for being found unresponsive with seizure activity  "Patient husband stated that she had been complaining of a headache and fatigue for 2-3 days prior to the day of admission, but her mental status was normal. He left for work on the morning of 8/20 and stated that she was talking to him normally while still in bed, but it was early in the morning and they did not have an extensive conversation. After he returned home from work in the afternoon he found her lying face down, naked in their bedroom. He tried to wake her but she had a seizure so he called 911. In the ED she had another seizure in the CT scanner. She has not been interactive or conversant since he found her this afternoon"  Hospital Course:  1. Suspected RPES; New oncet seizure, headaches, confusion, HTN;  -initial MRI: cortical and subcortical edema in the frontal, parietal and occipital lobes bilaterally ; repeat MRI: no change  -no new seizures on started on Keppra 1000 mg every 12 hours; d/c tramadol  -cont outpatient neurology follow up   2. Acute encephalopathy likely due to combination of  sezireu +HTN encephalopathy  -resolved' as above  3. Acute respiratory failure due to inability to protect airway in the setting of seizure  -resolved  4. COPD, stable; no wheezing; cont prn inhalers  5.HTN, controlled on current meds;  -started amlodipine,, lisinopril; titrate per response as outpatient; BMP in 1 week  6. DM cont home regimen upon d/c    No driving, operating heavy machinery, perform activities at heights, swimming or participation in water activities until release by outpatient physician. This has been discussed with patient.    MRI 8/24: IMPRESSION:  The persistent, predominantly posterior circulation vasogenic  subcortical edema without restricted diffusion is not significantly  improved from 06/17/2014. Correlate clinically for possible PRES.  Postictal phenomenon not excluded but less favored.  No evidence for herpetic encephalitis.   8/20 CT Head: no acute intracranial process  8/20 LP: negative (1-3 WBC/cubic mm)  8/21 MRI brain: Small areas of symmetric cortical and subcortical edema in the frontal, parietal and occipital lobes bilaterally - hypertensive encephalopathy vs Seizure  BCx2 8/20 >> ng  UC 8/20 >> ng  CSF bacterial culture 8/20 >> ng  CSF HSV PCR 8/20 >>  Urine strep 8/20 >> neg  P:  Vanc 8/20 >> 8/21  Ceftriaxone 8/20 >> 8/21  Ampicillin 8/20 >> 8/21  Acyclovir 8/20 >> 8/22  D/C dexamethasone     Procedures:  EEG (i.e. Studies not automatically included, echos, thoracentesis, etc; not x-rays)  Consultations:  Neurology   Discharge Exam: Filed Vitals:  06/21/14 0646  BP: 142/83  Pulse: 82  Temp: 98.3 F (36.8 C)  Resp: 15    General: alert Cardiovascular: s1,s2 rrr Respiratory: CTA BL  Discharge Instructions  Discharge Instructions   Diet - low sodium heart healthy    Complete by:  As directed      Discharge instructions    Complete by:  As directed   Please follow up with neurology in 2-3 weeks Please follow up  with PCP in 1 week     Increase activity slowly    Complete by:  As directed             Medication List    STOP taking these medications       ALPRAZolam 0.5 MG tablet  Commonly known as:  XANAX     traMADol 50 MG tablet  Commonly known as:  ULTRAM      TAKE these medications       amLODipine 5 MG tablet  Commonly known as:  NORVASC  Take 1 tablet (5 mg total) by mouth daily.     cyclobenzaprine 10 MG tablet  Commonly known as:  FLEXERIL  Take 1 tablet by mouth 3 (three) times daily as needed for muscle spasms.     diclofenac 75 MG EC tablet  Commonly known as:  VOLTAREN  Take 75 mg by mouth 2 (two) times daily.     escitalopram 20 MG tablet  Commonly known as:  LEXAPRO  Take 20 mg by mouth daily.     gabapentin 300 MG capsule  Commonly known as:  NEURONTIN  Take 300 mg by mouth 3 (three) times daily.     glipiZIDE 10 MG 24 hr tablet  Commonly known as:  GLUCOTROL XL  Take 10 mg by mouth daily with breakfast.     levETIRAcetam 1000 MG tablet  Commonly known as:  KEPPRA  Take 1 tablet (1,000 mg total) by mouth 2 (two) times daily.     levothyroxine 100 MCG tablet  Commonly known as:  SYNTHROID, LEVOTHROID  Take 100 mcg by mouth daily.     lisinopril 5 MG tablet  Commonly known as:  PRINIVIL,ZESTRIL  Take 1 tablet (5 mg total) by mouth daily.     oxyCODONE-acetaminophen 5-325 MG per tablet  Commonly known as:  PERCOCET/ROXICET  Take 1 tablet by mouth 2 (two) times daily as needed for severe pain.     pantoprazole 40 MG tablet  Commonly known as:  PROTONIX  Take 1 tablet by mouth daily.     pravastatin 20 MG tablet  Commonly known as:  PRAVACHOL  Take 20 mg by mouth daily.     PROVENTIL HFA 108 (90 BASE) MCG/ACT inhaler  Generic drug:  albuterol  INHALE 2 PUFFS EVERY 6 HOURS AS NEEDED (COUGH, WHEEZE, OR CHEST CONGESTION) SHORTNESS OF BREATH     VOLTAREN 1 % Gel  Generic drug:  diclofenac sodium  Apply 2 g topically 4 (four) times daily.        Allergies  Allergen Reactions  . Other Other (See Comments)    Steri strip breaks out       Follow-up Information   Follow up with ANDERSON,TERESA, FNP In 1 week.   Specialty:  Nurse Practitioner   Contact information:   [address] [place] [address] [phone]       Follow up with [facility]. Schedule an appointment as soon as possible for a visit in 2 weeks.  Contact information:   [address] [place] [address] [phone]       The results of significant diagnostics from this hospitalization (including imaging, microbiology, ancillary and laboratory) are listed below for reference.    Significant Diagnostic Studies: Ct Head Wo Contrast  06/16/2014   CLINICAL DATA:  Unresponsive, seizure activity  EXAM: CT HEAD WITHOUT CONTRAST  TECHNIQUE: Contiguous axial images were obtained from the base of the skull through the vertex without contrast.  COMPARISON:  None  FINDINGS: Mild brain atrophy without acute intracranial hemorrhage, mass lesion, definite infarction, mass effect, midline shift, herniation, or extra-axial fluid collection. Ventricles are symmetric. Cisterns are patent. Limited assessment of the posterior fossa because of positioning. Nasopharyngeal airway in place. Orbits are symmetric.  IMPRESSION: Limited exam but no acute intracranial process by noncontrast CT.   Electronically Signed   By: Ruel Favors M.D.   On: 06/16/2014 16:27   Mr Brain Wo Contrast  06/20/2014   CLINICAL DATA:  Admitted unresponsive with seizures, now controlled. Improving mental status. Unremarkable CSF. History of hypertension.  EXAM: MRI HEAD WITHOUT CONTRAST  TECHNIQUE: Multiplanar, multiecho pulse sequences of the brain and surrounding structures were obtained without intravenous contrast.  COMPARISON:  MR brain 06/17/2014.  CT head 06/16/14.  FINDINGS: No restricted diffusion or acute hemorrhage. No mass lesion or hydrocephalus. No  extra-axial fluid. Normal cerebral volume. No temporal lobe edema or hemorrhage to suggest herpetic encephalitis.  Premature for age cerebral and cerebellar atrophy. Mild periventricular and punctate subcortical white matter signal abnormality, likely superimposed small vessel disease.  There is persistent subcortical white matter vasogenic type edema in the BILATERAL LEFT greater than RIGHT occipital, posterior parietal, and LEFT posterior frontal regions. No cerebellar or brainstem involvement. No hemorrhagic transformation. Tiny focus of RIGHT cerebellar microhemorrhage better visualized on the previous study. Flow voids are maintained.  Moderate RIGHT mastoid effusion, likely due to recumbency. Small air-fluid level RIGHT maxillary sinus is improved. No upper cervical lesions. Partial empty sella.  IMPRESSION: The persistent, predominantly posterior circulation vasogenic subcortical edema without restricted diffusion is not significantly improved from 06/17/2014. Correlate clinically for possible PRES. Postictal phenomenon not excluded but less favored.  No evidence for herpetic encephalitis.   Electronically Signed   By: Davonna Belling M.D.   On: 06/20/2014 20:21   Mr Brain Wo Contrast  06/17/2014   CLINICAL DATA:  Seizure  EXAM: MRI HEAD WITHOUT CONTRAST  TECHNIQUE: Multiplanar, multiecho pulse sequences of the brain and surrounding structures were obtained without intravenous contrast.  COMPARISON:  CT head 06/16/2014  FINDINGS: Negative for acute infarct.  Ventricle size is normal.  No shift of the midline structures.  Small areas of hyperintensity in the frontal and parietal cortex and white matter bilaterally and in the occipital cortex and white matter bilaterally. The these do not show restricted diffusion. This is relatively symmetric. This may be related to hypertensive encephalopathy or possibly seizure related edema. Encephalitis less likely.  Chronic microhemorrhage right lateral cerebellum.  Negative for mass lesion.  Patient is intubated. Air-fluid levels in the right maxillary and sphenoid sinus. Air-fluid level in the posterior left ethmoid sinus. Right mastoid sinus effusion.  IMPRESSION: Negative for acute infarct.  Small areas of cortical and subcortical edema in the frontal, parietal and occipital lobes bilaterally. This is symmetric and is most likely related to hypertensive encephalopathy. Seizure related edema also possible.  Sinusitis with air-fluid levels.   Electronically Signed   By: Marlan Palau M.D.  On: 06/17/2014 18:38   Dg Chest Port 1 View  06/19/2014   CLINICAL DATA:  Endotracheal tube removal.  EXAM: PORTABLE CHEST - 1 VIEW  COMPARISON:  June 18, 2014.  FINDINGS: The heart size and mediastinal contours are within normal limits. Endotracheal tube has been removed. Nasogastric tube has been removed. Left internal jugular catheter remains with distal tip in expected position of the SVC. No pneumothorax or pleural effusion is noted. Both lungs are clear. The visualized skeletal structures are unremarkable.  IMPRESSION: Endotracheal and nasogastric tubes have been removed. Stable left internal jugular venous catheter. No acute cardiopulmonary abnormality seen.   Electronically Signed   By: Roque Lias M.D.   On: 06/19/2014 07:56   Dg Chest Port 1 View  06/18/2014   CLINICAL DATA:  Followup respiratory failure  EXAM: PORTABLE CHEST - 1 VIEW  COMPARISON:  06/16/2014  FINDINGS: Stable endotracheal tube and NG tube position. Left IJ central line is unchanged in position. Borderline cardiomegaly. Small left pleural effusion with left basilar atelectasis or infiltrate. No pulmonary edema.  IMPRESSION: Stable support apparatus. Small left pleural effusion with left basilar atelectasis or infiltrate. No pulmonary edema.   Electronically Signed   By: Natasha Mead M.D.   On: 06/18/2014 08:59   Dg Chest Port 1 View  06/17/2014   CLINICAL DATA:  Status post central line.  EXAM:  PORTABLE CHEST - 1 VIEW  COMPARISON:  06/16/2014 at 2138 hr.  FINDINGS: Endotracheal tube ends between the clavicles and carina. The orogastric tube reaches the stomach.  New left IJ catheter, tip obscured by EKG wires, but likely at the SVC.  Normal heart size and upper mediastinal contours. Unchanged curvilinear density at the peripheral left base. There is increasing interstitial markings, accentuated by lower lung volumes. No pneumothorax.  IMPRESSION: 1. New left IJ catheter.  No adverse findings. 2. Lower lung volumes but symmetric pulmonary inflation.   Electronically Signed   By: Tiburcio Pea M.D.   On: 06/17/2014 00:19   Dg Chest Portable 1 View  06/16/2014   CLINICAL DATA:  Intubation.  EXAM: PORTABLE CHEST - 1 VIEW  COMPARISON:  06/16/2014  FINDINGS: Endotracheal tube ends between the carina and clavicular heads. An orogastric tube reaches the stomach.  No cardiomegaly for technique. Stable upper mediastinal contours. No edema, consolidation, or pneumothorax. Curvilinear opacity at the left base again noted. As noted previously, appearance favors scarring or atelectasis over pleural effusion.  IMPRESSION: 1. New endotracheal and orogastric tubes are in good position. 2. Stable pulmonary inflation.   Electronically Signed   By: Tiburcio Pea M.D.   On: 06/16/2014 22:07   Dg Chest Port 1 View  06/16/2014   CLINICAL DATA:  Seizure and confusion.  EXAM: PORTABLE CHEST - 1 VIEW  COMPARISON:  12/23/2012  FINDINGS: Single view of the chest demonstrates slightly decreased lung volumes. Patient has a chronic curvilinear density at the left lung base that is more pronounced. Suspect this findings is related to atelectasis but difficult to exclude a small pleural effusion. Upper lungs are clear. Heart size is normal. The trachea is midline.  IMPRESSION: Left basilar densities probably related to a combination of volume loss and scar. Difficult to exclude small pleural effusion.   Electronically Signed    By: Richarda Overlie M.D.   On: 06/16/2014 15:29   Dg Lumbar Puncture Fluoro Guide  06/16/2014   CLINICAL DATA:  Fever, seizures, possible meningitis  EXAM: DIAGNOSTIC LUMBAR PUNCTURE UNDER FLUOROSCOPIC GUIDANCE  FLUOROSCOPY TIME:  0 min 39 seconds  PROCEDURE: Written informed consent was obtained from the patient's husband by emergency department staff; patient intubated.  Timeout protocol followed.  Patient due to patient intubation, patient was placed in a LEFT lateral decubitus position.  L2-L3 disc space was localized under fluoroscopy.  Skin prepped and draped in usual sterile fashion.  Skin and soft tissues anesthetized with 5 of 1% lidocaine.  Twenty needle was advanced into the spinal canal where clear colorless CSF was encountered with an opening pressure of 29 cm H2O.  10 mL of CSF was obtained in 4 tubes for requested analysis.  Procedure tolerated very well by patient without immediate complication.  IMPRESSION: Fluoroscopic guided lumbar puncture as above.   Electronically Signed   By: Ulyses Southward M.D.   On: 06/16/2014 20:42    Microbiology: Recent Results (from the past 240 hour(s))  URINE CULTURE     Status: None   Collection Time    06/16/14  3:34 PM      Result Value Ref Range Status   Specimen Description URINE, CATHETERIZED   Final   Special Requests NONE   Final   Culture  Setup Time     Final   Value: 06/16/2014 22:29     Performed at [facility] Count     Final   Value: NO GROWTH     Performed at [facility]   Culture     Final   Value: NO GROWTH     Performed at [facility]   Report Status 06/17/2014 FINAL   Final  CULTURE, BLOOD (ROUTINE X 2)     Status: None   Collection Time    06/16/14  4:44 PM      Result Value Ref Range Status   Specimen Description BLOOD ARM LEFT   Final   Special Requests BOTTLES DRAWN AEROBIC AND ANAEROBIC 10CC   Final   Culture  Setup Time     Final   Value: 06/16/2014 22:41     Performed at [facility]   Culture     Final   Value:        BLOOD CULTURE RECEIVED NO GROWTH TO DATE CULTURE WILL BE HELD FOR 5 DAYS BEFORE ISSUING A FINAL NEGATIVE REPORT     Performed at [facility]   Report Status PENDING   Incomplete  CULTURE, BLOOD (ROUTINE X 2)     Status: None   Collection Time    06/16/14  4:54 PM      Result Value Ref Range Status   Specimen Description BLOOD HAND LEFT   Final   Special Requests BOTTLES DRAWN AEROBIC ONLY 5CC   Final   Culture  Setup Time     Final   Value: 06/16/2014 22:39     Performed at [facility]   Culture     Final   Value:        BLOOD CULTURE RECEIVED NO GROWTH TO DATE CULTURE WILL BE HELD FOR 5 DAYS BEFORE ISSUING A FINAL NEGATIVE REPORT     Performed at [facility]   Report Status PENDING   Incomplete  CSF CULTURE     Status: None   Collection Time    06/16/14  8:20 PM      Result Value Ref Range Status   Specimen Description CSF   Final   Special Requests NO 2 2.5CC   Final   Gram Stain  Final   Value: WBC PRESENT,BOTH PMN AND MONONUCLEAR     NO ORGANISMS SEEN     CYTOSPIN Performed at [facility]     Performed at [facility]   Culture     Final   Value: NO GROWTH 3 DAYS     Performed at [facility]   Report Status 06/20/2014 FINAL   Final  GRAM STAIN     Status: None   Collection Time    06/16/14  8:20 PM      Result Value Ref Range Status   Specimen Description CSF   Final   Special Requests NO 2 2.5CC   Final   Gram Stain     Final   Value: WBC PRESENT,BOTH PMN AND MONONUCLEAR     NO ORGANISMS SEEN     CYTOSPIN SLIDE   Report Status 06/16/2014 FINAL   Final  MRSA PCR SCREENING     Status: None   Collection Time    06/16/14  9:35 PM      Result Value Ref Range Status   MRSA by PCR NEGATIVE  NEGATIVE Final   Comment:            The GeneXpert MRSA Assay (FDA     approved for NASAL specimens     only), is one component of a     comprehensive MRSA colonization      surveillance program. It is not     intended to diagnose MRSA     infection nor to guide or     monitor treatment for     MRSA infections.     Labs: Basic Metabolic Panel:  Recent Labs Lab 06/16/14 1654 06/16/14 1703 06/17/14 0630 06/18/14 0416 06/19/14 0500 06/20/14 0606  NA 140 137 139 145 146 140  K 4.4 4.2 3.3* 3.3* 3.3* 3.8  CL 97 99 101 106 106 101  CO2 22  --  GLUCOSE 199* 202* 195* 195* 117* 115*  BUN CREATININE 1.02 1.00 0.98 0.82 0.80 0.86  CALCIUM 10.1  --  8.6 8.2* 8.7 9.1   Liver Function Tests:  Recent Labs Lab 06/16/14 1654  AST 44*  ALT 33  ALKPHOS 96  BILITOT 0.3  PROT 7.8  ALBUMIN 4.3   No results found for this basename: LIPASE, AMYLASE,  in the last 168 hours No results found for this basename: AMMONIA,  in the last 168 hours CBC:  Recent Labs Lab 06/16/14 1654 06/16/14 1703 06/17/14 0630 06/18/14 0416 06/19/14 0500  WBC 19.3*  --  11.3* 9.8 8.2  NEUTROABS 17.5*  --   --   --   --   HGB 12.8 14.6 10.6* 9.5* 10.3*  HCT 38.5 43.0 32.2* 29.4* 31.5*  MCV 83.5  --  84.3 84.7 85.1  PLT 275  --  220 185 166   Cardiac Enzymes: No results found for this basename: CKTOTAL, CKMB, CKMBINDEX, TROPONINI,  in the last 168 hours BNP: BNP (last 3 results) No results found for this basename: PROBNP,  in the last 8760 hours CBG:  Recent Labs Lab 06/20/14 0801 06/20/14 1147 06/20/14 1718 06/20/14 2244 06/21/14 0800  GLUCAP 126* 108* 163* 130* 151*       Signed:  Anecia Nusbaum N  [facility] 06/21/2014, 10:44 AM

## 2014-06-22 LAB — CULTURE, BLOOD (ROUTINE X 2)
Culture: NO GROWTH
Culture: NO GROWTH

## 2014-07-11 LAB — ARBOVIRUS PANEL, ~~LOC~~ LAB

## 2014-07-20 ENCOUNTER — Telehealth: Payer: Self-pay | Admitting: *Deleted

## 2014-07-20 NOTE — Telephone Encounter (Signed)
Noted  

## 2014-07-20 NOTE — Telephone Encounter (Signed)
Patients husband called to cancel appointment due to her getting in with another dr sooner.

## 2014-07-25 ENCOUNTER — Ambulatory Visit: Payer: BC Managed Care – PPO | Admitting: Neurology

## 2014-08-09 ENCOUNTER — Other Ambulatory Visit: Payer: Self-pay | Admitting: Pulmonary Disease

## 2014-08-09 ENCOUNTER — Telehealth: Payer: Self-pay | Admitting: Pulmonary Disease

## 2014-08-09 NOTE — Telephone Encounter (Signed)
[id number] x1 

## 2014-08-10 NOTE — Telephone Encounter (Signed)
LMOM that rx refill for Advair was sent to pharmacy.

## 2014-09-20 ENCOUNTER — Ambulatory Visit (INDEPENDENT_AMBULATORY_CARE_PROVIDER_SITE_OTHER): Payer: 59 | Admitting: Pulmonary Disease

## 2014-09-20 ENCOUNTER — Encounter: Payer: Self-pay | Admitting: Pulmonary Disease

## 2014-09-20 VITALS — BP 114/70 | HR 92 | Temp 97.0°F | Ht 62.0 in | Wt 225.0 lb

## 2014-09-20 DIAGNOSIS — J449 Chronic obstructive pulmonary disease, unspecified: Secondary | ICD-10-CM

## 2014-09-20 MED ORDER — TIOTROPIUM BROMIDE MONOHYDRATE 18 MCG IN CAPS: 18.0000 ug | ORAL_CAPSULE | Freq: Every day | RESPIRATORY_TRACT | Status: DC

## 2014-09-20 MED ORDER — FLUTICASONE-SALMETEROL 250-50 MCG/DOSE IN AEPB: 1.0000 | INHALATION_SPRAY | Freq: Two times a day (BID) | RESPIRATORY_TRACT | Status: DC

## 2014-09-20 MED ORDER — ALBUTEROL SULFATE HFA 108 (90 BASE) MCG/ACT IN AERS: 2.0000 | INHALATION_SPRAY | Freq: Four times a day (QID) | RESPIRATORY_TRACT | Status: DC | PRN

## 2014-09-20 NOTE — Patient Instructions (Signed)
Follow up in 6 months 

## 2014-09-20 NOTE — Progress Notes (Signed)
Chief Complaint  Patient presents with  . Follow-up    pt c/o minimal sob,wheeze, cough and chest tightness    History of Present Illness: Donna SarasConnie M [name] is a 58 y.o. female former smoker with COPD.  She was in hospital for seizure with possible HTN encephalopathy.  She has not had seizure since August.  She was intubated during her hospital stay, but denies any issues with her throat.  She has intermittent cough, and wheeze.  She usually does not bring up sputum.  She denies chest pain, or leg swelling.  She uses spiriva, advair.  She uses proventil occasionally and this helps.  She denies snoring, sleep disruption, or daytime sleepiness.  TESTS: A1AT level 05/20/12 >> 131 (nl 83-199), MM phenotype. RA ONO 06/26/12 >> Test time 8 hrs 18 min.  Mean SpO2 94%, low SpO2 84%.  Spent 16 sec with SpO2 < 88%. PFT 07/07/12 >> FEV1 1.69 (73%), FEV1% 50, TLC 5.33 (112%), DLCO 48%, no BD. Spirometry 12/23/12 >> FEV1 1.20 (51%), FEV1% 62 Echo 12/31/12 >> mild LVH, EF 65 to 70%, grade 1 diastolic dysfunction, nl RV systolic fx  PMHx >> HTN, HLD, Chronic back pain, GERD, Hypothyroidism, Anxiety, DM, Seizure  PSHx, Medications, Allergies, Fhx, Shx reviewed.   Physical Exam:  General - No distress ENT - No sinus tenderness, no oral exudate, no LAN Cardiac - s1s2 regular, no murmur Chest - Diminished breath sounds, no wheeze/rales Back - No focal tenderness Abd - Soft, non-tender Ext - No edema Neuro - Normal strength Skin - No rashes Psych - normal mood, and behavior   Impression:  COPD. Plan: - refilled spiriva, advair, and proventil  Seizures with possible HTN encephalopathy in August 2015 >> she denies symptoms of sleep apnea. Plan: - advised her to monitor her sleep pattern  Coralyn HellingVineet Riley Hallum, MD [facility] Pager:  [phone]

## 2016-04-18 ENCOUNTER — Ambulatory Visit (INDEPENDENT_AMBULATORY_CARE_PROVIDER_SITE_OTHER): Payer: BLUE CROSS/BLUE SHIELD | Admitting: Pulmonary Disease

## 2016-04-18 ENCOUNTER — Encounter: Payer: Self-pay | Admitting: Pulmonary Disease

## 2016-04-18 ENCOUNTER — Telehealth: Payer: Self-pay | Admitting: Pulmonary Disease

## 2016-04-18 VITALS — BP 138/76 | HR 104 | Ht 62.0 in | Wt 232.4 lb

## 2016-04-18 DIAGNOSIS — J449 Chronic obstructive pulmonary disease, unspecified: Secondary | ICD-10-CM

## 2016-04-18 DIAGNOSIS — R0600 Dyspnea, unspecified: Secondary | ICD-10-CM

## 2016-04-18 DIAGNOSIS — Z23 Encounter for immunization: Secondary | ICD-10-CM | POA: Diagnosis not present

## 2016-04-18 MED ORDER — UMECLIDINIUM-VILANTEROL 62.5-25 MCG/INH IN AEPB: 1.0000 | INHALATION_SPRAY | Freq: Every day | RESPIRATORY_TRACT | Status: DC

## 2016-04-18 MED ORDER — ALBUTEROL SULFATE HFA 108 (90 BASE) MCG/ACT IN AERS: 2.0000 | INHALATION_SPRAY | Freq: Four times a day (QID) | RESPIRATORY_TRACT | Status: AC | PRN

## 2016-04-18 MED ORDER — ALBUTEROL SULFATE HFA 108 (90 BASE) MCG/ACT IN AERS: 2.0000 | INHALATION_SPRAY | Freq: Four times a day (QID) | RESPIRATORY_TRACT | Status: DC | PRN

## 2016-04-18 NOTE — Progress Notes (Signed)
Current Outpatient Prescriptions on File Prior to Visit  Medication Sig  . ALPRAZolam (XANAX) 0.5 MG tablet Take 0.5 mg by mouth at bedtime.  Marland Kitchen. amLODipine (NORVASC) 5 MG tablet Take 1 tablet (5 mg total) by mouth daily.  . cyclobenzaprine (FLEXERIL) 10 MG tablet Take 1 tablet by mouth 3 (three) times daily as needed for muscle spasms.   . diclofenac (VOLTAREN) 75 MG EC tablet Take 75 mg by mouth 2 (two) times daily.  Marland Kitchen. escitalopram (LEXAPRO) 20 MG tablet Take 20 mg by mouth daily.   Marland Kitchen. gabapentin (NEURONTIN) 300 MG capsule Take 300 mg by mouth 3 (three) times daily.  Marland Kitchen. glipiZIDE (GLUCOTROL XL) 10 MG 24 hr tablet Take 10 mg by mouth daily with breakfast.  . levETIRAcetam (KEPPRA) 1000 MG tablet Take 1 tablet (1,000 mg total) by mouth 2 (two) times daily.  Marland Kitchen. levothyroxine (SYNTHROID, LEVOTHROID) 100 MCG tablet Take 100 mcg by mouth daily.  Marland Kitchen. lisinopril (PRINIVIL,ZESTRIL) 5 MG tablet Take 1 tablet (5 mg total) by mouth daily.  Marland Kitchen. oxyCODONE-acetaminophen (PERCOCET/ROXICET) 5-325 MG per tablet Take 1 tablet by mouth 2 (two) times daily as needed for severe pain.  . pantoprazole (PROTONIX) 40 MG tablet Take 1 tablet by mouth daily.  . pravastatin (PRAVACHOL) 20 MG tablet Take 20 mg by mouth daily.  . VOLTAREN 1 % GEL Apply 2 g topically 4 (four) times daily.    No current facility-administered medications on file prior to visit.    Chief Complaint  Patient presents with  . Follow-up    Recent cxr 04/04/16. Pt states that her breathing has worsened since last OV. Current congested cough x 2 months with yellow mucus. Pt reports that mucus tastes terrible.Notes some SOB and possible wheezing. Pt has been out of medication x 2-3 months.    Tests A1AT level 05/20/12 >> 131 (nl 83-199), MM phenotype. RA ONO 06/26/12 >> Test time 8 hrs 18 min.  Mean SpO2 94%, low SpO2 84%.  Spent 16 sec with SpO2 < 88%. PFT 07/07/12 >> FEV1 1.69 (73%), FEV1% 50, TLC 5.33 (112%), DLCO 48%, no BD. Spirometry 12/23/12 >> FEV1  1.20 (51%), FEV1% 62 Echo 12/31/12 >> mild LVH, EF 65 to 70%, grade 1 diastolic dysfunction, nl RV systolic fx  Past medical history HTN, HLD, Chronic back pain, GERD, Hypothyroidism, Anxiety, DM, Seizure  Past surgical history, Family history, Social history, Allergies reviewed.  Vital signs BP 138/76 mmHg  Pulse 104  Ht 5\' 2"  (1.575 m)  Wt 232 lb 6.4 oz (105.416 kg)  BMI 42.50 kg/m2  SpO2 95%   History of Present Illness: Donna [name] is a 60 y.o. female former smoker with COPD.  I last saw her in 2015.  She ran out of her inhalers a few months ago.  She has noticed more cough, and sputum.  She is bringing up clear to white phlegm.  She denies chest pain, fever, leg swelling or hemoptysis.  She has noticed more allergy trouble over the past few days.  She is limited in her activity due to her breathing and back pain.  CXR from April 04, 2016 reviewed by me >> no infiltrates or effusions.  Physical Exam:  General - No distress ENT - No sinus tenderness, no oral exudate, no LAN Cardiac - s1s2 regular, no murmur Chest - Diminished breath sounds, no wheeze/rales Back - No focal tenderness Abd - Soft, non-tender Ext - No edema Neuro - Normal strength Skin - No rashes Psych - normal mood,  and behavior   Impression:  Dyspnea. - most likely from COPD and not being on inhalers recently - likely also component of deconditioning - if breathing not improved with resumption of inhalers, then she will need further testing  COPD with chronic bronchitis. - will have her try anoro in place of spiriva and advair >> sample given - prevnar shot today >> she will pneumovax next year - continue albuterol prn   Patient Instructions  Anoro one puff daily  Albuterol two puffs every 4 to 6 hours as need for cough, wheeze, or chest congestion  Prevnar shot today  Follow up in 1 year     Coralyn HellingVineet Jesse Nosbisch, MD [facility] Pager:  [phone] 04/18/2016,  11:09 AM

## 2016-04-18 NOTE — Telephone Encounter (Signed)
[facility] Rx has been sent to pharmacy. Patient aware. Nothing further needed.

## 2016-04-18 NOTE — Patient Instructions (Signed)
Anoro one puff daily  Albuterol two puffs every 4 to 6 hours as need for cough, wheeze, or chest congestion  Prevnar shot today  Follow up in 1 year

## 2016-04-18 NOTE — Addendum Note (Signed)
Addended by: Maisie FusGREEN, Terre Hanneman M on: 04/18/2016 12:22 PM   Modules accepted: Orders

## 2016-06-05 ENCOUNTER — Telehealth: Payer: Self-pay | Admitting: Pulmonary Disease

## 2016-06-05 NOTE — Telephone Encounter (Signed)
Called and spoke with pt and she stated that she wants to change back to the advair from the anoro.  She stated that the anoro seems to build up on the back of her throat and she cannot use it anymore.  VS please advise if ok to change back to advair.  thanks

## 2016-06-06 NOTE — Telephone Encounter (Signed)
Please send script for advair 205/50 one puff bid, dispense 1 inhaler with 11 refills.  Have her rinse mouth after each use.  Please d/c anoro from med list.

## 2016-06-06 NOTE — Telephone Encounter (Signed)
Attempted to contact pt. Voicemail has not been set up. Will try back. 

## 2016-06-07 MED ORDER — FLUTICASONE-SALMETEROL 250-50 MCG/DOSE IN AEPB: 1.0000 | INHALATION_SPRAY | Freq: Two times a day (BID) | RESPIRATORY_TRACT | 11 refills | Status: AC

## 2016-06-07 NOTE — Telephone Encounter (Signed)
Called and spoke with pt and she is aware of med change and that this has been called to her pharmacy. Nothing further is needed.

## 2017-06-02 ENCOUNTER — Encounter (HOSPITAL_COMMUNITY): Payer: Self-pay

## 2017-06-02 ENCOUNTER — Encounter (HOSPITAL_COMMUNITY)
Admission: RE | Admit: 2017-06-02 | Discharge: 2017-06-02 | Disposition: A | Discharge status: Home / Self Care | Payer: BLUE CROSS/BLUE SHIELD | Source: Ambulatory Visit | Attending: Orthopaedic Surgery | Admitting: Orthopaedic Surgery

## 2017-06-02 ENCOUNTER — Ambulatory Visit (HOSPITAL_COMMUNITY)
Admission: RE | Admit: 2017-06-02 | Discharge: 2017-06-02 | Disposition: A | Discharge status: Home / Self Care | Payer: BLUE CROSS/BLUE SHIELD | Source: Ambulatory Visit | Attending: Orthopaedic Surgery | Admitting: Orthopaedic Surgery

## 2017-06-02 DIAGNOSIS — Z01818 Encounter for other preprocedural examination: Secondary | ICD-10-CM

## 2017-06-02 DIAGNOSIS — M4854XA Collapsed vertebra, not elsewhere classified, thoracic region, initial encounter for fracture: Secondary | ICD-10-CM | POA: Diagnosis not present

## 2017-06-02 DIAGNOSIS — R9431 Abnormal electrocardiogram [ECG] [EKG]: Secondary | ICD-10-CM | POA: Insufficient documentation

## 2017-06-02 HISTORY — DX: Unspecified convulsions: R56.9

## 2017-06-02 LAB — URINALYSIS, ROUTINE W REFLEX MICROSCOPIC
BILIRUBIN URINE: NEGATIVE
Glucose, UA: NEGATIVE mg/dL
HGB URINE DIPSTICK: NEGATIVE
Ketones, ur: NEGATIVE mg/dL
NITRITE: NEGATIVE
PROTEIN: NEGATIVE mg/dL
Specific Gravity, Urine: 1.005 (ref 1.005–1.030)
pH: 6 (ref 5.0–8.0)

## 2017-06-02 LAB — CBC WITH DIFFERENTIAL/PLATELET
BASOS ABS: 0 10*3/uL (ref 0.0–0.1)
BASOS PCT: 0 %
EOS ABS: 0.1 10*3/uL (ref 0.0–0.7)
Eosinophils Relative: 1 %
HCT: 38.3 % (ref 36.0–46.0)
Hemoglobin: 11.9 g/dL — ABNORMAL LOW (ref 12.0–15.0)
Lymphocytes Relative: 26 %
Lymphs Abs: 2.1 10*3/uL (ref 0.7–4.0)
MCH: 24 pg — ABNORMAL LOW (ref 26.0–34.0)
MCHC: 31.1 g/dL (ref 30.0–36.0)
MCV: 77.2 fL — AB (ref 78.0–100.0)
MONO ABS: 0.5 10*3/uL (ref 0.1–1.0)
Monocytes Relative: 7 %
Neutro Abs: 5.1 10*3/uL (ref 1.7–7.7)
Neutrophils Relative %: 66 %
PLATELETS: 292 10*3/uL (ref 150–400)
RBC: 4.96 MIL/uL (ref 3.87–5.11)
RDW: 17.2 % — ABNORMAL HIGH (ref 11.5–15.5)
WBC: 7.8 10*3/uL (ref 4.0–10.5)

## 2017-06-02 LAB — SURGICAL PCR SCREEN
MRSA, PCR: NEGATIVE
Staphylococcus aureus: NEGATIVE

## 2017-06-02 LAB — BASIC METABOLIC PANEL
Anion gap: 10 (ref 5–15)
BUN: 12 mg/dL (ref 6–20)
CALCIUM: 9.9 mg/dL (ref 8.9–10.3)
CO2: 26 mmol/L (ref 22–32)
CREATININE: 0.93 mg/dL (ref 0.44–1.00)
Chloride: 101 mmol/L (ref 101–111)
GLUCOSE: 75 mg/dL (ref 65–99)
Potassium: 4.4 mmol/L (ref 3.5–5.1)
SODIUM: 137 mmol/L (ref 135–145)

## 2017-06-02 LAB — PROTIME-INR
INR: 0.94
Prothrombin Time: 12.6 seconds (ref 11.4–15.2)

## 2017-06-02 LAB — APTT: aPTT: 27 seconds (ref 24–36)

## 2017-06-02 LAB — GLUCOSE, CAPILLARY: Glucose-Capillary: 109 mg/dL — ABNORMAL HIGH (ref 65–99)

## 2017-06-02 NOTE — Pre-Procedure Instructions (Addendum)
Donna [name]  06/02/2017      [phone] [place] - [address] AT [facility] [address] [place] [id number] Phone: [phone] Fax: [phone]    Your procedure is scheduled on August 14  [facility] Ad[facility] at 0530 A.M.  Call this number if you have problems the morning of surgery:  [phone]   Remember:  Do not eat food or drink liquids after midnight.  Continue all other medications as directed by your physician except follow these instructions about you medications   Take these medicines the morning of surgery with A SIP OF WATER: albuterol (PROVENTIL HFA) amLODipine (NORVASC) cyclobenzaprine (FLEXERIL) Fluticasone-Salmeterol (ADVAIR DISKUS) gabapentin (NEURONTIN) levothyroxine (SYNTHROID, LEVOTHROID) Oxycodone-acetaminophen (Percocet) - if needed Tizanidine (Zanaflex)  7 days prior to surgery STOP taking any Aspirin, Aleve, Naproxen, Ibuprofen, Motrin, Advil, Goody's, BC's, all herbal medications, fish oil, and all vitamins - including voltaren gel   WHAT DO I DO ABOUT MY DIABETES MEDICATION?   Marland Kitchen. Do not take oral diabetes medicines (pills) the morning of surgery.  Metformin (Glucophage) or glipiZIDE (GLUCOTROL XL)   How to Manage Your Diabetes Before and After Surgery  Why is it important to control my blood sugar before and after surgery? . Improving blood sugar levels before and after surgery helps healing and can limit problems. . A way of improving blood sugar control is eating a healthy diet by: o  Eating less sugar and carbohydrates o  Increasing activity/exercise o  Talking with your doctor about reaching your blood sugar goals . High blood sugars (greater than 180 mg/dL) can raise your risk of infections and slow your recovery, so you will need to focus on controlling your diabetes during the weeks before surgery. . Make sure that the doctor who takes  care of your diabetes knows about your planned surgery including the date and location.  How do I manage my blood sugar before surgery? . Check your blood sugar at least 4 times a day, starting 2 days before surgery, to make sure that the level is not too high or low. o Check your blood sugar the morning of your surgery when you wake up and every 2 hours until you get to the Short Stay unit. . If your blood sugar is less than 70 mg/dL, you will need to treat for low blood sugar: o Do not take insulin. o Treat a low blood sugar (less than 70 mg/dL) with  cup of clear juice (cranberry or apple), 4 glucose tablets, OR glucose gel. o Recheck blood sugar in 15 minutes after treatment (to make sure it is greater than 70 mg/dL). If your blood sugar is not greater than 70 mg/dL on recheck, call [phone] for further instructions. . Report your blood sugar to the short stay nurse when you get to Short Stay.  . If you are admitted to the hospital after surgery: o Your blood sugar will be checked by the staff and you will probably be given insulin after surgery (instead of oral diabetes medicines) to make sure you have good blood sugar levels. o The goal for blood sugar control after surgery is 80-180 mg/dL    Do not wear jewelry, make-up or nail polish.  Do not wear lotions, powders, or perfumes, or deodorant.  Do not shave 48 hours prior to surgery.   Do not bring valuables to the hospital.  [facility] is  not responsible for any belongings or valuables.  Contacts, dentures or bridgework may not be worn into surgery.  Leave your suitcase in the car.  After surgery it may be brought to your room.  For patients admitted to the hospital, discharge time will be determined by your treatment team.  Patients discharged the day of surgery will not be allowed to drive home.    Special instructions:   [place] [facility]- Preparing For Surgery  Before surgery, you can play an important role. Because skin is  not sterile, your skin needs to be as free of germs as possible. You can reduce the number of germs on your skin by washing with CHG (chlorahexidine gluconate) Soap before surgery.  CHG is an antiseptic cleaner which kills germs and bonds with the skin to continue killing germs even after washing.  Please do not use if you have an allergy to CHG or antibacterial soaps. If your skin becomes reddened/irritated stop using the CHG.  Do not shave (including legs and underarms) for at least 48 hours prior to first CHG shower. It is OK to shave your face.  Please follow these instructions carefully.   1. Shower the NIGHT BEFORE SURGERY and the MORNING OF SURGERY with CHG.   2. If you chose to wash your hair, wash your hair first as usual with your normal shampoo.  3. After you shampoo, rinse your hair and body thoroughly to remove the shampoo.  4. Use CHG as you would any other liquid soap. You can apply CHG directly to the skin and wash gently with a scrungie or a clean washcloth.   5. Apply the CHG Soap to your body ONLY FROM THE NECK DOWN.  Do not use on open wounds or open sores. Avoid contact with your eyes, ears, mouth and genitals (private parts). Wash genitals (private parts) with your normal soap.  6. Wash thoroughly, paying special attention to the area where your surgery will be performed.  7. Thoroughly rinse your body with warm water from the neck down.  8. DO NOT shower/wash with your normal soap after using and rinsing off the CHG Soap.  9. Pat yourself dry with a CLEAN TOWEL.   10. Wear CLEAN PAJAMAS   11. Place CLEAN SHEETS on your bed the night of your first shower and DO NOT SLEEP WITH PETS.    Day of Surgery: Do not apply any deodorants/lotions. Please wear clean clothes to the hospital/surgery center.      Please read over the following fact sheets that you were given.

## 2017-06-02 NOTE — Progress Notes (Signed)
PCP - [facility] Cardiologist - patient denies  Chest x-ray - 06/02/2017 EKG - 06/02/2017 Stress Test - 12/31/2012 ECHO - patient denies Cardiac Cath - patient denies  Sleep Study - patient denies   Fasting Blood Sugar - 120's Checks Blood Sugar 2 times a day     Patient denies shortness of breath, fever, cough and chest pain at PAT appointment   Patient verbalized understanding of instructions that were given to them at the PAT appointment. Patient was also instructed that they will need to review over the PAT instructions again at home before surgery.

## 2017-06-02 NOTE — Pre-Procedure Instructions (Signed)
Donna [name]  06/02/2017      [id number] - [place], [place] - [address] AT [facility] [address] [place] [address] Phone: [phone] Fax: [phone]    Your procedure is scheduled on August 14  Report to [facility] Admitting at 0530 A.M.  Call this number if you have problems the morning of surgery:  [phone]   Remember:  Do not eat food or drink liquids after midnight.  Continue all other medications as directed by your physician except follow these instructions about you medications   Take these medicines the morning of surgery with A SIP OF WATER albuterol (PROVENTIL HFA),  amLODipine (NORVASC), cyclobenzaprine (FLEXERIL),  escitalopram (LEXAPRO), Fluticasone-Salmeterol (ADVAIR DISKUS), gabapentin (NEURONTIN),  levETIRAcetam (KEPPRA),  levothyroxine (SYNTHROID, LEVOTHROID), oxyCODONE-acetaminophen (PERCOCET/ROXICET),  pantoprazole (PROTONIX),   7 days prior to surgery STOP taking any Aspirin, Aleve, Naproxen, Ibuprofen, Motrin, Advil, Goody's, BC's, all herbal medications, fish oil, and all vitamins   WHAT DO I DO ABOUT MY DIABETES MEDICATION?   Marland Kitchen Do not take oral diabetes medicines (pills) the morning of surgery. glipiZIDE (GLUCOTROL XL)   How to Manage Your Diabetes Before and After Surgery  Why is it important to control my blood sugar before and after surgery? . Improving blood sugar levels before and after surgery helps healing and can limit problems. . A way of improving blood sugar control is eating a healthy diet by: o  Eating less sugar and carbohydrates o  Increasing activity/exercise o  Talking with your doctor about reaching your blood sugar goals . High blood sugars (greater than 180 mg/dL) can raise your risk of infections and slow your recovery, so you will need to focus on controlling your diabetes during the weeks before surgery. . Make sure that the doctor who takes  care of your diabetes knows about your planned surgery including the date and location.  How do I manage my blood sugar before surgery? . Check your blood sugar at least 4 times a day, starting 2 days before surgery, to make sure that the level is not too high or low. o Check your blood sugar the morning of your surgery when you wake up and every 2 hours until you get to the Short Stay unit. . If your blood sugar is less than 70 mg/dL, you will need to treat for low blood sugar: o Do not take insulin. o Treat a low blood sugar (less than 70 mg/dL) with  cup of clear juice (cranberry or apple), 4 glucose tablets, OR glucose gel. o Recheck blood sugar in 15 minutes after treatment (to make sure it is greater than 70 mg/dL). If your blood sugar is not greater than 70 mg/dL on recheck, call [phone] for further instructions. . Report your blood sugar to the short stay nurse when you get to Short Stay.  . If you are admitted to the hospital after surgery: o Your blood sugar will be checked by the staff and you will probably be given insulin after surgery (instead of oral diabetes medicines) to make sure you have good blood sugar levels. o The goal for blood sugar control after surgery is 80-180 mg/dL    Do not wear jewelry, make-up or nail polish.  Do not wear lotions, powders, or perfumes, or deoderant.  Do not shave 48 hours prior to surgery.  Men may shave face and neck.  Do not bring valuables to the  hospital.  [facility] is not responsible for any belongings or valuables.  Contacts, dentures or bridgework may not be worn into surgery.  Leave your suitcase in the car.  After surgery it may be brought to your room.  For patients admitted to the hospital, discharge time will be determined by your treatment team.  Patients discharged the day of surgery will not be allowed to drive home.    Special instructions:   [place]- Preparing For Surgery  Before surgery, you can play an  important role. Because skin is not sterile, your skin needs to be as free of germs as possible. You can reduce the number of germs on your skin by washing with CHG (chlorahexidine gluconate) Soap before surgery.  CHG is an antiseptic cleaner which kills germs and bonds with the skin to continue killing germs even after washing.  Please do not use if you have an allergy to CHG or antibacterial soaps. If your skin becomes reddened/irritated stop using the CHG.  Do not shave (including legs and underarms) for at least 48 hours prior to first CHG shower. It is OK to shave your face.  Please follow these instructions carefully.   1. Shower the NIGHT BEFORE SURGERY and the MORNING OF SURGERY with CHG.   2. If you chose to wash your hair, wash your hair first as usual with your normal shampoo.  3. After you shampoo, rinse your hair and body thoroughly to remove the shampoo.  4. Use CHG as you would any other liquid soap. You can apply CHG directly to the skin and wash gently with a scrungie or a clean washcloth.   5. Apply the CHG Soap to your body ONLY FROM THE NECK DOWN.  Do not use on open wounds or open sores. Avoid contact with your eyes, ears, mouth and genitals (private parts). Wash genitals (private parts) with your normal soap.  6. Wash thoroughly, paying special attention to the area where your surgery will be performed.  7. Thoroughly rinse your body with warm water from the neck down.  8. DO NOT shower/wash with your normal soap after using and rinsing off the CHG Soap.  9. Pat yourself dry with a CLEAN TOWEL.   10. Wear CLEAN PAJAMAS   11. Place CLEAN SHEETS on your bed the night of your first shower and DO NOT SLEEP WITH PETS.    Day of Surgery: Do not apply any deodorants/lotions. Please wear clean clothes to the hospital/surgery center.      Please read over the following fact sheets that you were given.

## 2017-06-03 LAB — TYPE AND SCREEN
ABO/RH(D): A POS
Antibody Screen: NEGATIVE

## 2017-06-03 LAB — HEMOGLOBIN A1C
HEMOGLOBIN A1C: 7 % — AB (ref 4.8–5.6)
MEAN PLASMA GLUCOSE: 154 mg/dL

## 2017-06-04 NOTE — H&P (Signed)
TOTAL HIP ADMISSION H&P  Patient is admitted for right total hip arthroplasty.  Subjective:  Chief Complaint: right hip pain  HPI: Donna [name], 61 y.o. female, has a history of pain and functional disability in the right hip(s) due to arthritis and patient has failed non-surgical conservative treatments for greater than 12 weeks to include NSAID's and/or analgesics, flexibility and strengthening excercises, use of assistive devices, weight reduction as appropriate and activity modification.  Onset of symptoms was gradual starting 5 years ago with gradually worsening course since that time.The patient noted no past surgery on the right hip(s).  Patient currently rates pain in the right hip at 10 out of 10 with activity. Patient has night pain, worsening of pain with activity and weight bearing, trendelenberg gait, pain that interfers with activities of daily living and pain with passive range of motion. Patient has evidence of subchondral cysts, subchondral sclerosis, periarticular osteophytes and joint space narrowing by imaging studies. This condition presents safety issues increasing the risk of falls. There is no current active infection.  Patient Active Problem List   Diagnosis Date Noted  . Status epilepticus (HCC) 06/16/2014  . Acute respiratory failure with hypercapnia (HCC) 06/16/2014  . Acute encephalopathy 06/16/2014  . Meningitis 06/16/2014  . Thrush 01/19/2014  . Diastolic dysfunction 01/21/2013  . COPD (chronic obstructive pulmonary disease) (HCC) 05/20/2012   Past Medical History:  Diagnosis Date  . Anxiety    takes Xanax prn  . Arthritis    back   . Bronchitis    hx of-many yrs ago  . Chronic back pain   . COPD (chronic obstructive pulmonary disease) (HCC)   . Diabetes mellitus    takes Metformin and GLipizide daily  . Diverticulosis   . GERD (gastroesophageal reflux disease)    takes Nexium daily  . Hyperlipidemia    takes Pravastatin daily  . Hypertension    takes Benazepril daily  . Hypothyroidism    takes SYnthroid daily  . Seizures (HCC)    has not had one since 2015; taken off seizure meds    Past Surgical History:  Procedure Laterality Date  . BACK SURGERY    . CESAREAN SECTION     x 2  . CHOLECYSTECTOMY    . COLONOSCOPY    . HEMORRHOID SURGERY    . TUBAL LIGATION      No prescriptions prior to admission.   Allergies  Allergen Reactions  . Other Other (See Comments)    Steri strip breaks out  . Tape Other (See Comments)    Steri strips caused infection  . Tramadol     Other reaction(s): Seizures    Social History  Substance Use Topics  . Smoking status: Former Smoker    Packs/day: 3.00    Years: 30.00    Types: Cigarettes    Quit date: 03/31/2006  . Smokeless tobacco: Never Used  . Alcohol use No    Family History  Problem Relation Age of Onset  . Lung cancer Father   . Heart disease Mother   . Anesthesia problems Neg Hx   . Hypotension Neg Hx   . Malignant hyperthermia Neg Hx   . Pseudochol deficiency Neg Hx      Review of Systems  Musculoskeletal: Positive for joint pain.       Right hip pain  All other systems reviewed and are negative.   Objective:  Physical Exam  Constitutional: She is oriented to person, place, and time. She appears well-developed and well-nourished.  HENT:  Head: Normocephalic and atraumatic.  Eyes: Pupils are equal, round, and reactive to light.  Neck: Normal range of motion.  Cardiovascular: Normal rate and regular rhythm.   Respiratory: Effort normal.  GI: Soft.  Musculoskeletal:  Right hip motion is terribly painful.  She has some central obesity but a thin hip and leg.  Sensation and motor function are intact distally.  Her leg lengths look relatively equal.  She has normal unlabored respirations.  Neurological: She is alert and oriented to person, place, and time.  Skin: Skin is warm and dry.  Psychiatric: She has a normal mood and affect. Her behavior is normal.  Judgment and thought content normal.    Vital signs in last 24 hours:    Labs:   Estimated body mass index is 40.68 kg/m as calculated from the following:   Height as of 06/02/17: 5\' 2"  (1.575 m).   Weight as of 06/02/17: 100.9 kg (222 lb 6.4 oz).   Imaging Review Plain radiographs demonstrate severe degenerative joint disease of the right hip(s). The bone quality appears to be good for age and reported activity level.  Assessment/Plan:  End stage primary arthritis, right hip(s)  The patient history, physical examination, clinical judgement of the provider and imaging studies are consistent with end stage degenerative joint disease of the right hip(s) and total hip arthroplasty is deemed medically necessary. The treatment options including medical management, injection therapy, arthroscopy and arthroplasty were discussed at length. The risks and benefits of total hip arthroplasty were presented and reviewed. The risks due to aseptic loosening, infection, stiffness, dislocation/subluxation,  thromboembolic complications and other imponderables were discussed.  The patient acknowledged the explanation, agreed to proceed with the plan and consent was signed. Patient is being admitted for inpatient treatment for surgery, pain control, PT, OT, prophylactic antibiotics, VTE prophylaxis, progressive ambulation and ADL's and discharge planning.The patient is planning to be discharged home with home health services

## 2017-06-09 ENCOUNTER — Encounter (HOSPITAL_COMMUNITY): Payer: Self-pay | Admitting: Anesthesiology

## 2017-06-09 MED ORDER — TRANEXAMIC ACID 1000 MG/10ML IV SOLN
2000.0000 mg | INTRAVENOUS | Status: AC
  Administered 2017-06-10: 2000 mg via TOPICAL
  Filled 2017-06-09: qty 20

## 2017-06-09 MED ORDER — LACTATED RINGERS IV SOLN
INTRAVENOUS | Status: DC
  Administered 2017-06-10 (×2): via INTRAVENOUS

## 2017-06-09 MED ORDER — DEXTROSE 5 % IV SOLN
3.0000 g | INTRAVENOUS | Status: AC
  Administered 2017-06-10: 3 g via INTRAVENOUS
  Filled 2017-06-09 (×2): qty 3000
  Filled 2017-06-09: qty 3

## 2017-06-09 NOTE — Anesthesia Preprocedure Evaluation (Addendum)
Anesthesia Evaluation  Patient identified by MRN, date of birth, ID band Patient awake    Reviewed: Allergy & Precautions, NPO status , Patient's Chart, lab work & pertinent test results  Airway Mallampati: I       Dental  (+) Upper Dentures, Dental Advisory Given   Pulmonary COPD,  COPD inhaler, former smoker,    breath sounds clear to auscultation       Cardiovascular hypertension,  Rhythm:Regular Rate:Normal     Neuro/Psych Seizures -,  Anxiety    GI/Hepatic Neg liver ROS, GERD  ,  Endo/Other  diabetes, Type 2, Oral Hypoglycemic AgentsHypothyroidism   Renal/GU negative Renal ROS     Musculoskeletal  (+) Arthritis ,   Abdominal   Peds  Hematology negative hematology ROS (+)   Anesthesia Other Findings Day of surgery medications reviewed with the patient.  Reproductive/Obstetrics negative OB ROS                            Lab Results  Component Value Date   WBC 7.8 06/02/2017   HGB 11.9 (L) 06/02/2017   HCT 38.3 06/02/2017   MCV 77.2 (L) 06/02/2017   PLT 292 06/02/2017   Lab Results  Component Value Date   INR 0.94 06/02/2017   INR 0.98 06/16/2014   EKG: NSR  Anesthesia Physical Anesthesia Plan  ASA: III  Anesthesia Plan: General   Post-op Pain Management:    Induction: Intravenous  PONV Risk Score and Plan: 3 and Ondansetron, Dexamethasone, Midazolam and Propofol infusion  Airway Management Planned: LMA  Additional Equipment:   Intra-op Plan:   Post-operative Plan: Extubation in OR  Informed Consent: I have reviewed the patients History and Physical, chart, labs and discussed the procedure including the risks, benefits and alternatives for the proposed anesthesia with the patient or authorized representative who has indicated his/her understanding and acceptance.   Dental advisory given  Plan Discussed with: CRNA  Anesthesia Plan Comments: (Pt declined  spinal. Pain mgmt: opioid, acetaminophen, ketorolac, ketamine. )       Anesthesia Quick Evaluation

## 2017-06-10 ENCOUNTER — Inpatient Hospital Stay (HOSPITAL_COMMUNITY): Payer: BLUE CROSS/BLUE SHIELD | Admitting: Anesthesiology

## 2017-06-10 ENCOUNTER — Inpatient Hospital Stay (HOSPITAL_COMMUNITY): Payer: BLUE CROSS/BLUE SHIELD

## 2017-06-10 ENCOUNTER — Encounter (HOSPITAL_COMMUNITY): Payer: Self-pay

## 2017-06-10 ENCOUNTER — Encounter (HOSPITAL_COMMUNITY)
Admission: RE | Disposition: A | Discharge status: Home / Self Care | Payer: Self-pay | Source: Ambulatory Visit | Attending: Orthopaedic Surgery

## 2017-06-10 ENCOUNTER — Inpatient Hospital Stay (HOSPITAL_COMMUNITY)
Admission: RE | Admit: 2017-06-10 | Discharge: 2017-06-12 | DRG: 470 | Disposition: A | Discharge status: Home / Self Care | Payer: BLUE CROSS/BLUE SHIELD | Source: Ambulatory Visit | Attending: Orthopaedic Surgery | Admitting: Orthopaedic Surgery

## 2017-06-10 DIAGNOSIS — Z7982 Long term (current) use of aspirin: Secondary | ICD-10-CM | POA: Diagnosis not present

## 2017-06-10 DIAGNOSIS — Z87891 Personal history of nicotine dependence: Secondary | ICD-10-CM | POA: Diagnosis not present

## 2017-06-10 DIAGNOSIS — M1611 Unilateral primary osteoarthritis, right hip: Secondary | ICD-10-CM | POA: Diagnosis present

## 2017-06-10 DIAGNOSIS — Z885 Allergy status to narcotic agent status: Secondary | ICD-10-CM | POA: Diagnosis not present

## 2017-06-10 DIAGNOSIS — K219 Gastro-esophageal reflux disease without esophagitis: Secondary | ICD-10-CM | POA: Diagnosis present

## 2017-06-10 DIAGNOSIS — E785 Hyperlipidemia, unspecified: Secondary | ICD-10-CM | POA: Diagnosis present

## 2017-06-10 DIAGNOSIS — Z981 Arthrodesis status: Secondary | ICD-10-CM

## 2017-06-10 DIAGNOSIS — J449 Chronic obstructive pulmonary disease, unspecified: Secondary | ICD-10-CM | POA: Diagnosis present

## 2017-06-10 DIAGNOSIS — Z888 Allergy status to other drugs, medicaments and biological substances status: Secondary | ICD-10-CM

## 2017-06-10 DIAGNOSIS — E039 Hypothyroidism, unspecified: Secondary | ICD-10-CM | POA: Diagnosis present

## 2017-06-10 DIAGNOSIS — Z419 Encounter for procedure for purposes other than remedying health state, unspecified: Secondary | ICD-10-CM

## 2017-06-10 DIAGNOSIS — I10 Essential (primary) hypertension: Secondary | ICD-10-CM | POA: Diagnosis present

## 2017-06-10 DIAGNOSIS — E119 Type 2 diabetes mellitus without complications: Secondary | ICD-10-CM | POA: Diagnosis present

## 2017-06-10 HISTORY — PX: TOTAL HIP ARTHROPLASTY: SHX124

## 2017-06-10 LAB — GLUCOSE, CAPILLARY
GLUCOSE-CAPILLARY: 226 mg/dL — AB (ref 65–99)
GLUCOSE-CAPILLARY: 199 mg/dL — AB (ref 65–99)
GLUCOSE-CAPILLARY: 127 mg/dL — AB (ref 65–99)
Glucose-Capillary: 270 mg/dL — ABNORMAL HIGH (ref 65–99)
Glucose-Capillary: 190 mg/dL — ABNORMAL HIGH (ref 65–99)

## 2017-06-10 SURGERY — ARTHROPLASTY, HIP, TOTAL, ANTERIOR APPROACH
Anesthesia: General | Site: Hip | Laterality: Right

## 2017-06-10 MED ORDER — ACETAMINOPHEN 325 MG PO TABS
650.0000 mg | ORAL_TABLET | Freq: Four times a day (QID) | ORAL | Status: DC | PRN
  Administered 2017-06-11 – 2017-06-12 (×2): 650 mg via ORAL
  Filled 2017-06-10 (×2): qty 2

## 2017-06-10 MED ORDER — AMLODIPINE BESYLATE 2.5 MG PO TABS
2.5000 mg | ORAL_TABLET | Freq: Every day | ORAL | Status: DC
  Administered 2017-06-11 – 2017-06-12 (×2): 2.5 mg via ORAL
  Filled 2017-06-10 (×2): qty 1

## 2017-06-10 MED ORDER — BUPIVACAINE HCL (PF) 0.5 % IJ SOLN
INTRAMUSCULAR | Status: DC | PRN
  Administered 2017-06-10: 20 mL

## 2017-06-10 MED ORDER — ONDANSETRON HCL 4 MG/2ML IJ SOLN
INTRAMUSCULAR | Status: AC
  Filled 2017-06-10: qty 2

## 2017-06-10 MED ORDER — DOCUSATE SODIUM 100 MG PO CAPS
100.0000 mg | ORAL_CAPSULE | Freq: Two times a day (BID) | ORAL | Status: DC
  Administered 2017-06-10 – 2017-06-12 (×5): 100 mg via ORAL
  Filled 2017-06-10 (×6): qty 1

## 2017-06-10 MED ORDER — TRAZODONE HCL 100 MG PO TABS
100.0000 mg | ORAL_TABLET | Freq: Every day | ORAL | Status: DC
  Administered 2017-06-10 – 2017-06-11 (×2): 100 mg via ORAL
  Filled 2017-06-10 (×2): qty 1

## 2017-06-10 MED ORDER — 0.9 % SODIUM CHLORIDE (POUR BTL) OPTIME
TOPICAL | Status: DC | PRN
  Administered 2017-06-10: 1000 mL

## 2017-06-10 MED ORDER — FENTANYL CITRATE (PF) 250 MCG/5ML IJ SOLN
INTRAMUSCULAR | Status: AC
  Filled 2017-06-10: qty 5

## 2017-06-10 MED ORDER — TIOTROPIUM BROMIDE MONOHYDRATE 18 MCG IN CAPS
18.0000 ug | ORAL_CAPSULE | Freq: Every day | RESPIRATORY_TRACT | Status: DC
  Administered 2017-06-11 – 2017-06-12 (×2): 18 ug via RESPIRATORY_TRACT
  Filled 2017-06-10: qty 5

## 2017-06-10 MED ORDER — PHENYLEPHRINE 40 MCG/ML (10ML) SYRINGE FOR IV PUSH (FOR BLOOD PRESSURE SUPPORT): 40.0000 ug | PREFILLED_SYRINGE | INTRAVENOUS | Status: DC | PRN

## 2017-06-10 MED ORDER — LACTATED RINGERS IV SOLN: INTRAVENOUS | Status: DC

## 2017-06-10 MED ORDER — ONDANSETRON HCL 4 MG/2ML IJ SOLN: 4.0000 mg | Freq: Four times a day (QID) | INTRAMUSCULAR | Status: DC | PRN

## 2017-06-10 MED ORDER — OXYCODONE HCL 5 MG PO TABS
5.0000 mg | ORAL_TABLET | ORAL | Status: DC | PRN
  Administered 2017-06-10 – 2017-06-12 (×9): 10 mg via ORAL
  Filled 2017-06-10 (×8): qty 2

## 2017-06-10 MED ORDER — GLIPIZIDE ER 10 MG PO TB24
10.0000 mg | ORAL_TABLET | Freq: Two times a day (BID) | ORAL | Status: DC
Start: 2017-06-10 — End: 2017-06-12
  Administered 2017-06-10 – 2017-06-12 (×4): 10 mg via ORAL
  Filled 2017-06-10 (×5): qty 1

## 2017-06-10 MED ORDER — HYDROMORPHONE HCL 1 MG/ML IJ SOLN
0.5000 mg | INTRAMUSCULAR | Status: DC | PRN
  Administered 2017-06-11: 1 mg via INTRAVENOUS
  Filled 2017-06-10: qty 1

## 2017-06-10 MED ORDER — LISINOPRIL 5 MG PO TABS
5.0000 mg | ORAL_TABLET | Freq: Every day | ORAL | Status: DC
  Administered 2017-06-11: 5 mg via ORAL
  Filled 2017-06-10: qty 1

## 2017-06-10 MED ORDER — PRAVASTATIN SODIUM 20 MG PO TABS
20.0000 mg | ORAL_TABLET | Freq: Every evening | ORAL | Status: DC
  Administered 2017-06-10 – 2017-06-11 (×2): 20 mg via ORAL
  Filled 2017-06-10 (×2): qty 1

## 2017-06-10 MED ORDER — CHLORHEXIDINE GLUCONATE 4 % EX LIQD: 60.0000 mL | Freq: Once | CUTANEOUS | Status: DC

## 2017-06-10 MED ORDER — PROPOFOL 10 MG/ML IV BOLUS
INTRAVENOUS | Status: DC | PRN
  Administered 2017-06-10: 150 mg via INTRAVENOUS

## 2017-06-10 MED ORDER — METHOCARBAMOL 500 MG PO TABS
ORAL_TABLET | ORAL | Status: AC
Start: 2017-06-10 — End: 2017-06-10
  Filled 2017-06-10: qty 1

## 2017-06-10 MED ORDER — BUPIVACAINE LIPOSOME 1.3 % IJ SUSP: INTRAMUSCULAR | Status: DC | PRN

## 2017-06-10 MED ORDER — INSULIN ASPART 100 UNIT/ML ~~LOC~~ SOLN
0.0000 [IU] | Freq: Three times a day (TID) | SUBCUTANEOUS | Status: DC
  Administered 2017-06-10: 4 [IU] via SUBCUTANEOUS
  Administered 2017-06-11: 3 [IU] via SUBCUTANEOUS
  Administered 2017-06-11: 7 [IU] via SUBCUTANEOUS
  Administered 2017-06-12: 3 [IU] via SUBCUTANEOUS

## 2017-06-10 MED ORDER — ALBUTEROL SULFATE HFA 108 (90 BASE) MCG/ACT IN AERS: 2.0000 | INHALATION_SPRAY | Freq: Four times a day (QID) | RESPIRATORY_TRACT | Status: DC | PRN

## 2017-06-10 MED ORDER — KETOROLAC TROMETHAMINE 30 MG/ML IJ SOLN
INTRAMUSCULAR | Status: AC
  Filled 2017-06-10: qty 1

## 2017-06-10 MED ORDER — HYDROMORPHONE HCL 1 MG/ML IJ SOLN
INTRAMUSCULAR | Status: AC
  Filled 2017-06-10: qty 1

## 2017-06-10 MED ORDER — KETAMINE HCL-SODIUM CHLORIDE 100-0.9 MG/10ML-% IV SOSY
PREFILLED_SYRINGE | INTRAVENOUS | Status: AC
  Filled 2017-06-10: qty 10

## 2017-06-10 MED ORDER — ONDANSETRON HCL 4 MG PO TABS: 4.0000 mg | ORAL_TABLET | Freq: Four times a day (QID) | ORAL | Status: DC | PRN

## 2017-06-10 MED ORDER — MAGNESIUM OXIDE 400 (241.3 MG) MG PO TABS
400.0000 mg | ORAL_TABLET | Freq: Every day | ORAL | Status: DC
  Administered 2017-06-10 – 2017-06-11 (×2): 400 mg via ORAL
  Filled 2017-06-10 (×2): qty 1

## 2017-06-10 MED ORDER — METHOCARBAMOL 500 MG PO TABS
500.0000 mg | ORAL_TABLET | Freq: Four times a day (QID) | ORAL | Status: DC | PRN
  Administered 2017-06-10 – 2017-06-12 (×6): 500 mg via ORAL
  Filled 2017-06-10 (×5): qty 1

## 2017-06-10 MED ORDER — METFORMIN HCL 500 MG PO TABS
1000.0000 mg | ORAL_TABLET | Freq: Two times a day (BID) | ORAL | Status: DC
  Administered 2017-06-10 – 2017-06-12 (×4): 1000 mg via ORAL
  Filled 2017-06-10 (×4): qty 2

## 2017-06-10 MED ORDER — ACETAMINOPHEN 10 MG/ML IV SOLN
INTRAVENOUS | Status: DC | PRN
  Administered 2017-06-10: 1000 mg via INTRAVENOUS

## 2017-06-10 MED ORDER — MENTHOL 3 MG MT LOZG: 1.0000 | LOZENGE | OROMUCOSAL | Status: DC | PRN

## 2017-06-10 MED ORDER — MOMETASONE FURO-FORMOTEROL FUM 200-5 MCG/ACT IN AERO
2.0000 | INHALATION_SPRAY | Freq: Two times a day (BID) | RESPIRATORY_TRACT | Status: DC
  Administered 2017-06-10 – 2017-06-12 (×3): 2 via RESPIRATORY_TRACT
  Filled 2017-06-10: qty 8.8

## 2017-06-10 MED ORDER — FENTANYL CITRATE (PF) 250 MCG/5ML IJ SOLN
INTRAMUSCULAR | Status: DC | PRN
  Administered 2017-06-10 (×2): 50 ug via INTRAVENOUS

## 2017-06-10 MED ORDER — DEXAMETHASONE SODIUM PHOSPHATE 10 MG/ML IJ SOLN
INTRAMUSCULAR | Status: AC
  Filled 2017-06-10: qty 1

## 2017-06-10 MED ORDER — PHENOL 1.4 % MT LIQD: 1.0000 | OROMUCOSAL | Status: DC | PRN

## 2017-06-10 MED ORDER — ACETAMINOPHEN 650 MG RE SUPP: 650.0000 mg | Freq: Four times a day (QID) | RECTAL | Status: DC | PRN

## 2017-06-10 MED ORDER — PROMETHAZINE HCL 25 MG/ML IJ SOLN: 6.2500 mg | INTRAMUSCULAR | Status: DC | PRN

## 2017-06-10 MED ORDER — CEFAZOLIN SODIUM-DEXTROSE 2-4 GM/100ML-% IV SOLN
2.0000 g | Freq: Four times a day (QID) | INTRAVENOUS | Status: AC
  Administered 2017-06-10 (×2): 2 g via INTRAVENOUS
  Filled 2017-06-10 (×2): qty 100

## 2017-06-10 MED ORDER — BUPIVACAINE HCL (PF) 0.5 % IJ SOLN
INTRAMUSCULAR | Status: AC
  Filled 2017-06-10: qty 20

## 2017-06-10 MED ORDER — HYDROMORPHONE HCL 1 MG/ML IJ SOLN
0.2500 mg | INTRAMUSCULAR | Status: DC | PRN
  Administered 2017-06-10 (×4): 0.5 mg via INTRAVENOUS

## 2017-06-10 MED ORDER — TIZANIDINE HCL 4 MG PO TABS: 4.0000 mg | ORAL_TABLET | Freq: Three times a day (TID) | ORAL | Status: DC | PRN

## 2017-06-10 MED ORDER — MIDAZOLAM HCL 5 MG/5ML IJ SOLN
INTRAMUSCULAR | Status: DC | PRN
  Administered 2017-06-10: 2 mg via INTRAVENOUS

## 2017-06-10 MED ORDER — DULOXETINE HCL 60 MG PO CPEP: 60.0000 mg | ORAL_CAPSULE | Freq: Every evening | ORAL | Status: DC

## 2017-06-10 MED ORDER — SODIUM CHLORIDE 0.9 % IV SOLN
1000.0000 mg | Freq: Once | INTRAVENOUS | Status: AC
  Administered 2017-06-10: 1000 mg via INTRAVENOUS
  Filled 2017-06-10: qty 10

## 2017-06-10 MED ORDER — BISACODYL 5 MG PO TBEC: 5.0000 mg | DELAYED_RELEASE_TABLET | Freq: Every day | ORAL | Status: DC | PRN

## 2017-06-10 MED ORDER — LEVOTHYROXINE SODIUM 112 MCG PO TABS
112.0000 ug | ORAL_TABLET | Freq: Every day | ORAL | Status: DC
  Administered 2017-06-11 – 2017-06-12 (×2): 112 ug via ORAL
  Filled 2017-06-10 (×2): qty 1

## 2017-06-10 MED ORDER — PROPOFOL 10 MG/ML IV BOLUS
INTRAVENOUS | Status: AC
  Filled 2017-06-10: qty 20

## 2017-06-10 MED ORDER — GABAPENTIN 300 MG PO CAPS
300.0000 mg | ORAL_CAPSULE | Freq: Three times a day (TID) | ORAL | Status: DC
  Administered 2017-06-10 – 2017-06-12 (×6): 300 mg via ORAL
  Filled 2017-06-10 (×6): qty 1

## 2017-06-10 MED ORDER — METHOCARBAMOL 1000 MG/10ML IJ SOLN
500.0000 mg | Freq: Four times a day (QID) | INTRAVENOUS | Status: DC | PRN
  Filled 2017-06-10: qty 5

## 2017-06-10 MED ORDER — MIDAZOLAM HCL 2 MG/2ML IJ SOLN
INTRAMUSCULAR | Status: AC
Start: 2017-06-10 — End: 2017-06-10
  Filled 2017-06-10: qty 2

## 2017-06-10 MED ORDER — DEXAMETHASONE SODIUM PHOSPHATE 10 MG/ML IJ SOLN
INTRAMUSCULAR | Status: DC | PRN
  Administered 2017-06-10: 5 mg via INTRAVENOUS

## 2017-06-10 MED ORDER — PHENYLEPHRINE HCL 10 MG/ML IJ SOLN
INTRAMUSCULAR | Status: DC | PRN
  Administered 2017-06-10: 75 ug/min via INTRAVENOUS

## 2017-06-10 MED ORDER — MAGNESIUM OXIDE -MG SUPPLEMENT 500 MG PO TABS: 500.0000 mg | ORAL_TABLET | Freq: Every day | ORAL | Status: DC

## 2017-06-10 MED ORDER — LIDOCAINE 2% (20 MG/ML) 5 ML SYRINGE
INTRAMUSCULAR | Status: DC | PRN
  Administered 2017-06-10: 80 mg via INTRAVENOUS

## 2017-06-10 MED ORDER — ALUM & MAG HYDROXIDE-SIMETH 200-200-20 MG/5ML PO SUSP: 30.0000 mL | ORAL | Status: DC | PRN

## 2017-06-10 MED ORDER — TRANEXAMIC ACID 1000 MG/10ML IV SOLN
1000.0000 mg | INTRAVENOUS | Status: AC
  Administered 2017-06-10: 1000 mg via INTRAVENOUS
  Filled 2017-06-10: qty 1100

## 2017-06-10 MED ORDER — METOCLOPRAMIDE HCL 5 MG PO TABS
5.0000 mg | ORAL_TABLET | Freq: Three times a day (TID) | ORAL | Status: DC | PRN
Start: 2017-06-10 — End: 2017-06-12

## 2017-06-10 MED ORDER — ACETAMINOPHEN 10 MG/ML IV SOLN
INTRAVENOUS | Status: AC
  Filled 2017-06-10: qty 100

## 2017-06-10 MED ORDER — PHENYLEPHRINE 40 MCG/ML (10ML) SYRINGE FOR IV PUSH (FOR BLOOD PRESSURE SUPPORT)
PREFILLED_SYRINGE | INTRAVENOUS | Status: DC | PRN
  Administered 2017-06-10: 160 ug via INTRAVENOUS
  Administered 2017-06-10 (×2): 80 ug via INTRAVENOUS

## 2017-06-10 MED ORDER — DIPHENHYDRAMINE HCL 12.5 MG/5ML PO ELIX: 12.5000 mg | ORAL_SOLUTION | ORAL | Status: DC | PRN

## 2017-06-10 MED ORDER — BUPIVACAINE LIPOSOME 1.3 % IJ SUSP
20.0000 mL | INTRAMUSCULAR | Status: AC
  Administered 2017-06-10: 20 mL
  Filled 2017-06-10: qty 20

## 2017-06-10 MED ORDER — KETOROLAC TROMETHAMINE 30 MG/ML IJ SOLN
30.0000 mg | Freq: Once | INTRAMUSCULAR | Status: AC
  Administered 2017-06-10: 30 mg via INTRAVENOUS

## 2017-06-10 MED ORDER — ASPIRIN EC 325 MG PO TBEC
325.0000 mg | DELAYED_RELEASE_TABLET | Freq: Two times a day (BID) | ORAL | Status: DC
  Administered 2017-06-11 – 2017-06-12 (×3): 325 mg via ORAL
  Filled 2017-06-10 (×3): qty 1

## 2017-06-10 MED ORDER — METOCLOPRAMIDE HCL 5 MG/ML IJ SOLN: 5.0000 mg | Freq: Three times a day (TID) | INTRAMUSCULAR | Status: DC | PRN

## 2017-06-10 MED ORDER — ALBUTEROL SULFATE (2.5 MG/3ML) 0.083% IN NEBU: 2.5000 mg | INHALATION_SOLUTION | Freq: Four times a day (QID) | RESPIRATORY_TRACT | Status: DC | PRN

## 2017-06-10 MED ORDER — MEPERIDINE HCL 25 MG/ML IJ SOLN: 6.2500 mg | INTRAMUSCULAR | Status: DC | PRN

## 2017-06-10 MED ORDER — OXYCODONE HCL 5 MG PO TABS
ORAL_TABLET | ORAL | Status: AC
  Filled 2017-06-10: qty 2

## 2017-06-10 MED ORDER — KETAMINE HCL 10 MG/ML IJ SOLN
INTRAMUSCULAR | Status: DC | PRN
  Administered 2017-06-10: 30 mg via INTRAVENOUS
  Administered 2017-06-10: 20 mg via INTRAVENOUS

## 2017-06-10 MED ORDER — FERROUS SULFATE 325 (65 FE) MG PO TABS
325.0000 mg | ORAL_TABLET | ORAL | Status: DC
  Administered 2017-06-10 – 2017-06-11 (×2): 325 mg via ORAL
  Filled 2017-06-10 (×2): qty 1

## 2017-06-10 SURGICAL SUPPLY — 44 items
BLADE SAW SGTL 18X1.27X75 (BLADE) ×2 IMPLANT
CAPT HIP TOTAL 2 ×1 IMPLANT
CELLS DAT CNTRL 66122 CELL SVR (MISCELLANEOUS) ×1 IMPLANT
COVER PERINEAL POST (MISCELLANEOUS) ×2 IMPLANT
COVER SURGICAL LIGHT HANDLE (MISCELLANEOUS) ×2 IMPLANT
DRAPE C-ARM 42X72 X-RAY (DRAPES) ×2 IMPLANT
DRAPE STERI IOBAN 125X83 (DRAPES) ×2 IMPLANT
DRAPE U-SHAPE 47X51 STRL (DRAPES) ×6 IMPLANT
DRSG AQUACEL AG ADV 3.5X10 (GAUZE/BANDAGES/DRESSINGS) ×2 IMPLANT
DURAPREP 26ML APPLICATOR (WOUND CARE) ×2 IMPLANT
ELECT BLADE 4.0 EZ CLEAN MEGAD (MISCELLANEOUS)
ELECT REM PT RETURN 9FT ADLT (ELECTROSURGICAL) ×2
ELECTRODE BLDE 4.0 EZ CLN MEGD (MISCELLANEOUS) IMPLANT
ELECTRODE REM PT RTRN 9FT ADLT (ELECTROSURGICAL) ×1 IMPLANT
FACESHIELD WRAPAROUND (MASK) ×4 IMPLANT
FACESHIELD WRAPAROUND OR TEAM (MASK) ×2 IMPLANT
GLOVE BIO SURGEON STRL SZ8 (GLOVE) ×4 IMPLANT
GLOVE BIOGEL PI IND STRL 8 (GLOVE) ×2 IMPLANT
GLOVE BIOGEL PI INDICATOR 8 (GLOVE) ×2
GOWN STRL REUS W/ TWL LRG LVL3 (GOWN DISPOSABLE) ×1 IMPLANT
GOWN STRL REUS W/ TWL XL LVL3 (GOWN DISPOSABLE) ×2 IMPLANT
GOWN STRL REUS W/TWL LRG LVL3 (GOWN DISPOSABLE) ×2
GOWN STRL REUS W/TWL XL LVL3 (GOWN DISPOSABLE) ×4
KIT BASIN OR (CUSTOM PROCEDURE TRAY) ×2 IMPLANT
KIT ROOM TURNOVER OR (KITS) ×2 IMPLANT
MANIFOLD NEPTUNE II (INSTRUMENTS) ×2 IMPLANT
NEEDLE HYPO 22GX1.5 SAFETY (NEEDLE) ×2 IMPLANT
NS IRRIG 1000ML POUR BTL (IV SOLUTION) ×2 IMPLANT
PACK TOTAL JOINT (CUSTOM PROCEDURE TRAY) ×2 IMPLANT
PAD ARMBOARD 7.5X6 YLW CONV (MISCELLANEOUS) ×2 IMPLANT
RETRACTOR WND ALEXIS 18 MED (MISCELLANEOUS) ×1 IMPLANT
RTRCTR WOUND ALEXIS 18CM MED (MISCELLANEOUS) ×2
SUT ETHIBOND NAB CT1 #1 30IN (SUTURE) ×4 IMPLANT
SUT MNCRL AB 3-0 PS2 27 (SUTURE) ×2 IMPLANT
SUT VIC AB 1 CT1 27 (SUTURE) ×2
SUT VIC AB 1 CT1 27XBRD ANBCTR (SUTURE) ×1 IMPLANT
SUT VIC AB 2-0 CT1 27 (SUTURE) ×2
SUT VIC AB 2-0 CT1 TAPERPNT 27 (SUTURE) ×1 IMPLANT
SUT VIC AB 3-0 FS2 27 (SUTURE) ×1 IMPLANT
SUT VLOC 180 0 24IN GS25 (SUTURE) ×2 IMPLANT
SYR 50ML LL SCALE MARK (SYRINGE) ×2 IMPLANT
TOWEL OR 17X24 6PK STRL BLUE (TOWEL DISPOSABLE) ×2 IMPLANT
TOWEL OR 17X26 10 PK STRL BLUE (TOWEL DISPOSABLE) ×2 IMPLANT
TRAY CATH 16FR W/PLASTIC CATH (SET/KITS/TRAYS/PACK) IMPLANT

## 2017-06-10 NOTE — Anesthesia Procedure Notes (Signed)
Procedure Name: LMA Insertion Date/Time: 06/10/2017 7:39 AM Performed by: Army FossaPULLIAM, Jacere Pangborn DANE Pre-anesthesia Checklist: Patient identified, Emergency Drugs available, Suction available and Patient being monitored Patient Re-evaluated:Patient Re-evaluated prior to induction Oxygen Delivery Method: Circle System Utilized Preoxygenation: Pre-oxygenation with 100% oxygen Induction Type: IV induction Ventilation: Mask ventilation without difficulty LMA: LMA inserted LMA Size: 4.0 Number of attempts: 1 Airway Equipment and Method: Bite block Placement Confirmation: positive ETCO2 Tube secured with: Tape Dental Injury: Teeth and Oropharynx as per pre-operative assessment

## 2017-06-10 NOTE — Anesthesia Postprocedure Evaluation (Signed)
Anesthesia Post Note  Patient: Robyn HaberConnie M Pham  Procedure(s) Performed: Procedure(s) (LRB): RIGHT TOTAL HIP ARTHROPLASTY ANTERIOR APPROACH (Right)     Patient location during evaluation: PACU Anesthesia Type: General Level of consciousness: awake and alert Pain management: pain level controlled Vital Signs Assessment: post-procedure vital signs reviewed and stable Respiratory status: spontaneous breathing, nonlabored ventilation, respiratory function stable and patient connected to nasal cannula oxygen Cardiovascular status: blood pressure returned to baseline and stable Postop Assessment: no signs of nausea or vomiting Anesthetic complications: no    Last Vitals:  Vitals:   06/10/17 1130 06/10/17 1200  BP: 117/64 109/62  Pulse: (!) 109 (!) 106  Resp: 12 10  Temp:    SpO2: 93% 97%    Last Pain:  Vitals:   06/10/17 1130  TempSrc:   PainSc: 6        resting pain prior to surgery 10/10          Shelton SilvasKevin D Kofi Murrell

## 2017-06-10 NOTE — Evaluation (Signed)
Physical Therapy Evaluation Patient Details Name: Donna SarasConnie M [name] MRN: [id number] DOB: 12/02/1955 Today's Date: 06/10/2017   History of Present Illness  Pt is a 61 y/o female s/p elective R THA, direct anterior approach. PMH includes seizures, HTN, arthritis, COPD, and DM.   Clinical Impression  Pt is s/p surgery above with deficits below. PTA, pt used cane for ambulation. Upon eval, pt limited by post op pain and weakness, as well as, decreased balance. Pt requiring min guard assist for mobility this session. Pt reports husband will be able to assist as needed and has all DME. Follow up PT per MD arrangements. Will continue to follow acutely to maximize functional mobility independence and safety.     Follow Up Recommendations DC plan and follow up therapy as arranged by surgeon;Supervision for mobility/OOB    Equipment Recommendations  None recommended by PT    Recommendations for Other Services       Precautions / Restrictions Precautions Precautions: None Precaution Comments: Reviewed THA exercise handout with pt.  Restrictions Weight Bearing Restrictions: Yes RLE Weight Bearing: Weight bearing as tolerated      Mobility  Bed Mobility Overal bed mobility: Needs Assistance Bed Mobility: Supine to Sit     Supine to sit: Supervision     General bed mobility comments: Supervision for safety. Use of bed rails for trunk elevation and scooting hips.   Transfers Overall transfer level: Needs assistance Equipment used: Rolling walker (2 wheeled) Transfers: Sit to/from Stand Sit to Stand: Min guard         General transfer comment: Min guard for safety. Verbal cues for safe hand placement.   Ambulation/Gait Ambulation/Gait assistance: Min guard Ambulation Distance (Feet): 25 Feet Assistive device: Rolling walker (2 wheeled) Gait Pattern/deviations: Step-to pattern;Decreased step length - right;Decreased step length - left;Decreased weight shift to right;Antalgic;Trunk  flexed Gait velocity: Decreased Gait velocity interpretation: Below normal speed for age/gender General Gait Details: Slow, antalgic gait secondary to post op pain and weakness. Verbal cues for proximity to device and upright posture. Verbal cues for LE sequencing with use of RW.   Stairs            Wheelchair Mobility    Modified Rankin (Stroke Patients Only)       Balance Overall balance assessment: Needs assistance Sitting-balance support: No upper extremity supported;Feet supported Sitting balance-Leahy Scale: Good     Standing balance support: Bilateral upper extremity supported;During functional activity;No upper extremity supported Standing balance-Leahy Scale: Fair Standing balance comment: Able to maintain static standing without UE support to perform toileting activities.                              Pertinent Vitals/Pain Pain Assessment: 0-10 Pain Score: 7  Pain Location: R hip  Pain Descriptors / Indicators: Aching;Operative site guarding;Sore Pain Intervention(s): Limited activity within patient's tolerance;Monitored during session;Repositioned    Home Living Family/patient expects to be discharged to:: Private residence Living Arrangements: Spouse/significant other;Children Available Help at Discharge: Family;Available 24 hours/day Type of Home: House Home Access: Stairs to enter Entrance Stairs-Rails: None Entrance Stairs-Number of Steps: 2 Home Layout: One level Home Equipment: Bedside commode;Walker - 2 wheels;Cane - single point;Walker - 4 wheels      Prior Function Level of Independence: Independent with assistive device(s)         Comments: Used cane for ambulation      Hand Dominance   Dominant Hand: Right    Extremity/Trunk  Assessment   Upper Extremity Assessment Upper Extremity Assessment: Overall WFL for tasks assessed    Lower Extremity Assessment Lower Extremity Assessment: RLE deficits/detail RLE Deficits /  Details: Sensory in tact. Deficits consistent with post op pain and weakness. Able to perform exercises below.     Cervical / Trunk Assessment Cervical / Trunk Assessment: Normal  Communication   Communication: No difficulties  Cognition Arousal/Alertness: Awake/alert Behavior During Therapy: WFL for tasks assessed/performed Overall Cognitive Status: Within Functional Limits for tasks assessed                                        General Comments      Exercises Total Joint Exercises Ankle Circles/Pumps: AROM;Both;10 reps;Supine Quad Sets: AROM;Right;10 reps;Supine Short Arc Quad: AROM;Right;10 reps;Supine Heel Slides: AAROM;Right;10 reps;Supine Hip ABduction/ADduction: AAROM;Right;10 reps;Supine   Assessment/Plan    PT Assessment Patient needs continued PT services  PT Problem List Decreased strength;Decreased range of motion;Decreased balance;Decreased mobility;Decreased knowledge of use of DME;Decreased knowledge of precautions;Pain       PT Treatment Interventions DME instruction;Gait training;Stair training;Functional mobility training;Therapeutic activities;Therapeutic exercise;Balance training;Neuromuscular re-education;Patient/family education    PT Goals (Current goals can be found in the Care Plan section)  Acute Rehab PT Goals Patient Stated Goal: to go home  PT Goal Formulation: With patient Time For Goal Achievement: 06/17/17 Potential to Achieve Goals: Good    Frequency 7X/week   Barriers to discharge        Co-evaluation               AM-PAC PT "6 Clicks" Daily Activity  Outcome Measure Difficulty turning over in bed (including adjusting bedclothes, sheets and blankets)?: A Little Difficulty moving from lying on back to sitting on the side of the bed? : A Little Difficulty sitting down on and standing up from a chair with arms (e.g., wheelchair, bedside commode, etc,.)?: Total Help needed moving to and from a bed to chair  (including a wheelchair)?: A Little Help needed walking in hospital room?: A Little Help needed climbing 3-5 steps with a railing? : A Lot 6 Click Score: 15    End of Session Equipment Utilized During Treatment: Gait belt Activity Tolerance: Patient tolerated treatment well Patient left: in chair;with call bell/phone within reach Nurse Communication: Mobility status PT Visit Diagnosis: Other abnormalities of gait and mobility (R26.89);Pain Pain - Right/Left: Right Pain - part of body: Hip    Time: 1456-1520 PT Time Calculation (min) (ACUTE ONLY): 24 min   Charges:   PT Evaluation $PT Eval Low Complexity: 1 Low PT Treatments $Gait Training: 8-22 mins   PT G Codes:        Gladys Damme, PT, DPT  Acute Rehabilitation Services  Pager: [phone]   Lehman Prom 06/10/2017, 3:39 PM

## 2017-06-10 NOTE — Op Note (Signed)
PRE-OP DIAGNOSIS:  RIGHT HIP DEGENERATIVE JOINT DISEASE POST-OP DIAGNOSIS:  same PROCEDURE: RIGHT TOTAL HIP ARTHROPLASTY ANTERIOR APPROACH ANESTHESIA:  General SURGEON:  Marcene CorningPeter Tammra Pressman MD ASSISTANT:  Elodia FlorenceAndrew Nida PA-C   INDICATIONS FOR PROCEDURE:  The patient is a 61 y.o. female with a long history of a painful hip.  This has persisted despite multiple conservative measures.  The patient has persisted with pain and dysfunction making rest and activity difficult.  A total hip replacement is offered as surgical treatment.  Informed operative consent was obtained after discussion of possible complications including reaction to anesthesia, infection, neurovascular injury, dislocation, DVT, PE, and death.  The importance of the postoperative rehab program to optimize result was stressed with the patient.  SUMMARY OF FINDINGS AND PROCEDURE:  Under general anesthesia through a anterior approach an the Hana table a right THR was performed.  The patient had severe degenerative change and good bone quality.  We used DePuy components to replace the hip and these were size KLA 11 Corail femur capped with a +1 32mm ceramic hip ball.  On the acetabular side we used a size 48 Gription shell with a  plus 0 neutral polyethylene liner.  We did use a hole eliminator.  Elodia FlorenceAndrew Nida PA-C assisted throughout and was invaluable to the completion of the case in that he helped position and retract while I performed the procedure.  He also closed simultaneously to help minimize OR time.  I used fluoroscopy throughout the case to check position of implants and leg lengths and read all of these views myself.  DESCRIPTION OF PROCEDURE:  The patient was taken to the OR suite where general anesthetic was applied.  The patient was then positioned on the Hana table supine.  All bony prominences were appropriately padded.  Prep and drape was then performed in normal sterile fashion.  The patient was given kefzol preoperative antibiotic and  an appropriate time out was performed.  We then took an anterior approach to the right hip.  Dissection was taken through adipose to the tensor fascia lata fascia.  This structure was incised longitudinally and we dissected in the intermuscular interval just medial to this muscle.  Cobra retractors were placed superior and inferior to the femoral neck superficial to the capsule.  A capsular incision was then made and the retractors were placed along the femoral neck.  Xray was brought in to get a good level for the femoral neck cut which was made with an oscillating saw and osteotome.  The femoral head was removed with a corkscrew.  The acetabulum was exposed and some labral tissues were excised. Reaming was taken to the inside wall of the pelvis and sequentially up to 1 mm smaller than the actual component.  A trial of components was done and then the aforementioned acetabular shell was placed in appropriate tilt and anteversion confirmed by fluoroscopy. The liner was placed along with the hole eliminator and attention was turned to the femur.  The leg was brought down and over into adduction and the elevator bar was used to raise the femur up gently in the wound.  The piriformis was released with care taken to preserve the obturator internus attachment and all of the posterior capsule. The femur was reamed and then broached to the appropriate size.  A trial reduction was done and the aforementioned head and neck assembly gave us the best stability in extension with external rotation.  Leg lengths were felt to be about equal by fluoroscopic exam.  The trial components were removed and the wound irrigated.  We then placed the femoral component in appropriate anteversion.  The head was applied to a dry stem neck and the hip again reduced.  It was again stable in the aforementioned position.  The would was irrigated again followed by re-approximation of anterior capsule with ethibond suture. Tensor fascia was repaired  with V-loc suture  followed by deep closure with #O and #2 undyed vicryl.  Skin was closed with subQ stitch and steristrips followed by a sterile dressing.  EBL and IOF can be obtained from anesthesia records.  DISPOSITION:  The patient was extubated in the OR and taken to PACU in stable condition to be admitted to the Orthopedic Surgery for appropriate post-op care to include perioperative antibiotics and DVT prophylaxis.

## 2017-06-10 NOTE — Interval H&P Note (Signed)
History and Physical Interval Note:  06/10/2017 7:18 AM  Donna [name]  has presented today for surgery, with the diagnosis of RIGHT HIP DEGENERATIVE JOINT DISEASE  The various methods of treatment have been discussed with the patient and family. After consideration of risks, benefits and other options for treatment, the patient has consented to  Procedure(s): RIGHT TOTAL HIP ARTHROPLASTY ANTERIOR APPROACH (Right) as a surgical intervention .  The patient's history has been reviewed, patient examined, no change in status, stable for surgery.  I have reviewed the patient's chart and labs.  Questions were answered to the patient's satisfaction.     Latiesha Harada G

## 2017-06-10 NOTE — Transfer of Care (Signed)
Immediate Anesthesia Transfer of Care Note  Patient: Donna HaberConnie M [name]  Procedure(s) Performed: Procedure(s): RIGHT TOTAL HIP ARTHROPLASTY ANTERIOR APPROACH (Right)  Patient Location: PACU  Anesthesia Type:General  Level of Consciousness: drowsy and patient cooperative  Airway & Oxygen Therapy: Patient Spontanous Breathing and Patient connected to face mask oxygen  Post-op Assessment: Report given to RN and Post -op Vital signs reviewed and stable  Post vital signs: Reviewed and stable  Last Vitals:  Vitals:   06/10/17 0943 06/10/17 0945  BP:  (!) (P) 87/57  Pulse:  98  Resp:  10  Temp: 37.2 C   SpO2:  100%    Last Pain:  Vitals:   06/10/17 0627  TempSrc: Oral  PainSc: 7       Patients Stated Pain Goal: 0 (06/10/17 [id number])  Complications: No apparent anesthesia complications

## 2017-06-11 ENCOUNTER — Encounter (HOSPITAL_COMMUNITY): Payer: Self-pay | Admitting: Orthopaedic Surgery

## 2017-06-11 LAB — GLUCOSE, CAPILLARY
GLUCOSE-CAPILLARY: 201 mg/dL — AB (ref 65–99)
GLUCOSE-CAPILLARY: 83 mg/dL (ref 65–99)
GLUCOSE-CAPILLARY: 103 mg/dL — AB (ref 65–99)
Glucose-Capillary: 144 mg/dL — ABNORMAL HIGH (ref 65–99)

## 2017-06-11 MED ORDER — SODIUM CHLORIDE 0.9 % IV BOLUS (SEPSIS)
500.0000 mL | Freq: Once | INTRAVENOUS | Status: AC
  Administered 2017-06-11: 500 mL via INTRAVENOUS

## 2017-06-11 NOTE — Progress Notes (Signed)
Physical Therapy Treatment Patient Details Name: Donna SarasConnie M [name] MRN: [id number] DOB: 09/18/1956 Today's Date: 06/11/2017    History of Present Illness Pt is a 61 y/o female s/p elective R THA, direct anterior approach. PMH includes seizures, HTN, arthritis, COPD, and DM.     PT Comments    Patient progressing with ambulation this session.  Limited by soreness requesting more assist for OOB.  She will benefit from continued skilled PT in the acute setting prior to d/c home with family assist and follow up PT.    Follow Up Recommendations  DC plan and follow up therapy as arranged by surgeon;Supervision for mobility/OOB     Equipment Recommendations  Rolling walker with 5" wheels    Recommendations for Other Services       Precautions / Restrictions Precautions Precautions: None Restrictions Weight Bearing Restrictions: Yes RLE Weight Bearing: Weight bearing as tolerated    Mobility  Bed Mobility Overal bed mobility: Needs Assistance Bed Mobility: Supine to Sit     Supine to sit: Min assist     General bed mobility comments: assist for R LE and lifting trunk with rails  Transfers Overall transfer level: Needs assistance Equipment used: Rolling walker (2 wheeled) Transfers: Sit to/from Stand Sit to Stand: Min guard         General transfer comment: Min guard for safety. Verbal cues for safe hand placement.   Ambulation/Gait Ambulation/Gait assistance: Min guard Ambulation Distance (Feet): 80 Feet Assistive device: Rolling walker (2 wheeled) Gait Pattern/deviations: Step-to pattern;Trunk flexed;Decreased stride length;Antalgic     General Gait Details: increased time for ambulation to bathroom then to hallway.  cues for sequence, posture and step length throughout   Stairs            Wheelchair Mobility    Modified Rankin (Stroke Patients Only)       Balance Overall balance assessment: Needs assistance Sitting-balance support: No upper extremity  supported Sitting balance-Leahy Scale: Good       Standing balance-Leahy Scale: Fair Standing balance comment: washed hands with S, completed perineal hygiene unaided                            Cognition Arousal/Alertness: Awake/alert Behavior During Therapy: WFL for tasks assessed/performed Overall Cognitive Status: Within Functional Limits for tasks assessed                                        Exercises Total Joint Exercises Ankle Circles/Pumps: AROM;Both;10 reps;Supine Short Arc Quad: AROM;Right;10 reps;Supine Heel Slides: AAROM;Right;10 reps;Supine Hip ABduction/ADduction: AAROM;Right;10 reps;Supine    General Comments General comments (skin integrity, edema, etc.): son in room and assisting his mom throughout      Pertinent Vitals/Pain Pain Assessment: Faces Faces Pain Scale: Hurts even more Pain Location: R hip  Pain Descriptors / Indicators: Aching;Grimacing;Operative site guarding;Sore Pain Intervention(s): Monitored during session;Repositioned    Home Living                      Prior Function            PT Goals (current goals can now be found in the care plan section) Progress towards PT goals: Progressing toward goals    Frequency    7X/week      PT Plan Current plan remains appropriate    Co-evaluation  AM-PAC PT "6 Clicks" Daily Activity  Outcome Measure  Difficulty turning over in bed (including adjusting bedclothes, sheets and blankets)?: A Little Difficulty moving from lying on back to sitting on the side of the bed? : Total Difficulty sitting down on and standing up from a chair with arms (e.g., wheelchair, bedside commode, etc,.)?: Total Help needed moving to and from a bed to chair (including a wheelchair)?: A Little Help needed walking in hospital room?: A Little Help needed climbing 3-5 steps with a railing? : A Little 6 Click Score: 14    End of Session Equipment Utilized  During Treatment: Gait belt Activity Tolerance: Patient tolerated treatment well Patient left: in chair;with call bell/phone within reach;with family/visitor present   PT Visit Diagnosis: Other abnormalities of gait and mobility (R26.89);Pain Pain - Right/Left: Right Pain - part of body: Hip     Time: 1005-1041 PT Time Calculation (min) (ACUTE ONLY): 36 min  Charges:  $Gait Training: 8-22 mins $Therapeutic Exercise: 8-22 mins                    G CodesSheran Lawless, Stark [phone] 06/11/2017    Elray Mcgregor 06/11/2017, 11:18 AM

## 2017-06-11 NOTE — Progress Notes (Signed)
Physical Therapy Treatment Patient Details Name: Donna SarasConnie M [name] MRN: [id number] DOB: 06/26/1956 Today's Date: 06/11/2017    History of Present Illness Pt is a 61 y/o female s/p elective R THA, direct anterior approach. PMH includes seizures, HTN, arthritis, COPD, and DM.     PT Comments    Patient progressing slowly with mobility and needing encouragement to walk farther this session.  Will benefit from follow up skilled PT (HHPT) at d/c.   Follow Up Recommendations  DC plan and follow up therapy as arranged by surgeon;Supervision for mobility/OOB     Equipment Recommendations  Rolling walker with 5" wheels    Recommendations for Other Services       Precautions / Restrictions Precautions Precautions: None Precaution Comments: Reviewed THA exercise handout with pt.  Restrictions RLE Weight Bearing: Weight bearing as tolerated    Mobility  Bed Mobility Overal bed mobility: Needs Assistance Bed Mobility: Supine to Sit;Sit to Supine     Supine to sit: Min assist;HOB elevated Sit to supine: Min assist   General bed mobility comments: increased time with use of rails, cues for technique, assist for R LE  Transfers Overall transfer level: Needs assistance Equipment used: Rolling walker (2 wheeled) Transfers: Sit to/from Stand Sit to Stand: Min guard         General transfer comment: assist for balance, cuse for technique  Ambulation/Gait Ambulation/Gait assistance: Min guard Ambulation Distance (Feet): 100 Feet Assistive device: Rolling walker (2 wheeled) Gait Pattern/deviations: Step-through pattern;Step-to pattern;Trunk flexed;Wide base of support;Antalgic     General Gait Details: increased time with cues and encouragement for distance as well as several stops to rest her arms along the way   Stairs            Wheelchair Mobility    Modified Rankin (Stroke Patients Only)       Balance Overall balance assessment: Needs assistance Sitting-balance  support: No upper extremity supported Sitting balance-Leahy Scale: Good     Standing balance support: Bilateral upper extremity supported;During functional activity;No upper extremity supported Standing balance-Leahy Scale: Fair Standing balance comment: washed hands with S, completed perineal hygiene unaided                            Cognition Arousal/Alertness: Awake/alert Behavior During Therapy: WFL for tasks assessed/performed Overall Cognitive Status: Within Functional Limits for tasks assessed                                        Exercises      General Comments General comments (skin integrity, edema, etc.): spouse in room assisting pt      Pertinent Vitals/Pain Faces Pain Scale: Hurts even more Pain Location: R hip  Pain Descriptors / Indicators: Aching;Grimacing;Operative site guarding;Sore Pain Intervention(s): Monitored during session;Repositioned;Ice applied    Home Living                      Prior Function            PT Goals (current goals can now be found in the care plan section) Progress towards PT goals: Progressing toward goals    Frequency    7X/week      PT Plan Current plan remains appropriate    Co-evaluation              AM-PAC PT "6 Clicks"  Daily Activity  Outcome Measure  Difficulty turning over in bed (including adjusting bedclothes, sheets and blankets)?: A Little Difficulty moving from lying on back to sitting on the side of the bed? : Total Difficulty sitting down on and standing up from a chair with arms (e.g., wheelchair, bedside commode, etc,.)?: Total Help needed moving to and from a bed to chair (including a wheelchair)?: A Little Help needed walking in hospital room?: A Little Help needed climbing 3-5 steps with a railing? : A Little 6 Click Score: 14    End of Session Equipment Utilized During Treatment: Gait belt Activity Tolerance: Patient tolerated treatment  well Patient left: in bed;with call bell/phone within reach;with nursing/sitter in room   PT Visit Diagnosis: Other abnormalities of gait and mobility (R26.89);Pain Pain - Right/Left: Right Pain - part of body: Hip     Time: 1610-9604 PT Time Calculation (min) (ACUTE ONLY): 29 min  Charges:  $Gait Training: 23-37 mins                    G CodesSheran Lawless,  [phone] 06/11/2017    Elray Mcgregor 06/11/2017, 5:35 PM

## 2017-06-11 NOTE — Progress Notes (Signed)
Subjective: 1 Day Post-Op Procedure(s) (LRB): RIGHT TOTAL HIP ARTHROPLASTY ANTERIOR APPROACH (Right)  Activity level:  wbat Diet tolerance:  ok Voiding:  ok Patient reports pain as mild and moderate.    Objective: Vital signs in last 24 hours: Temp:  [97.6 F (36.4 C)-99 F (37.2 C)] 98.3 F (36.8 C) (08/15 0409) Pulse Rate:  [43-110] 100 (08/15 0409) Resp:  [8-18] 18 (08/15 0409) BP: (75-163)/(50-78) 149/78 (08/15 0719) SpO2:  [93 %-100 %] 99 % (08/15 0409)  Labs: No results for input(s): HGB in the last 72 hours. No results for input(s): WBC, RBC, HCT, PLT in the last 72 hours. No results for input(s): NA, K, CL, CO2, BUN, CREATININE, GLUCOSE, CALCIUM in the last 72 hours. No results for input(s): LABPT, INR in the last 72 hours.  Physical Exam:  Neurologically intact ABD soft Neurovascular intact Sensation intact distally Intact pulses distally Dorsiflexion/Plantar flexion intact Incision: dressing C/D/I and no drainage No cellulitis present Compartment soft  Assessment/Plan:  1 Day Post-Op Procedure(s) (LRB): RIGHT TOTAL HIP ARTHROPLASTY ANTERIOR APPROACH (Right) Advance diet Up with therapy Plan for discharge tomorrow Discharge home with home health if doing well and cleared by PT. Continue on ASA 325mg  BID x 2 weeks post op. Follow up in office 2 weeks post op.  Genavive Kubicki, Ginger OrganNDREW PAUL 06/11/2017, 7:40 AM

## 2017-06-11 NOTE — Care Management Note (Signed)
Case Management Note  Patient Details  Name: Donna SarasConnie M [name] MRN: [id number] Date of Birth: 03/19/1956  Subjective/Objective:     61 yr old female s/p right total hip arthroplasty.             Action/Plan: Case manager spoke with patient and her husband concerning discharge plan and DME . Patient was preoperatively setup with [facility], no changes. Patient says she will borrow 3in1. She will have family support at discharge.     Expected Discharge Date:    06/12/17              Expected Discharge Plan:  Home w Home Health Services  In-House Referral:  NA  Discharge planning Services  CM Consult  Post Acute Care Choice:  Home Health, Durable Medical Equipment Choice offered to:  Patient, Spouse  DME Arranged:  Walker rolling DME Agency:  [employer]  HH Arranged:  PT HH Agency:  [facility]  Status of Service:  Completed, signed off  If discussed at Long Length of Stay Meetings, dates discussed:    Additional Comments:  Durenda GuthrieBrady, Kortlyn Koltz Naomi, RN 06/11/2017, 10:47 AM

## 2017-06-12 LAB — GLUCOSE, CAPILLARY
GLUCOSE-CAPILLARY: 146 mg/dL — AB (ref 65–99)
Glucose-Capillary: 120 mg/dL — ABNORMAL HIGH (ref 65–99)

## 2017-06-12 MED ORDER — BISACODYL 5 MG PO TBEC: 5.0000 mg | DELAYED_RELEASE_TABLET | Freq: Every day | ORAL | 0 refills | Status: AC | PRN

## 2017-06-12 MED ORDER — ASPIRIN 325 MG PO TBEC: 325.0000 mg | DELAYED_RELEASE_TABLET | Freq: Two times a day (BID) | ORAL | 0 refills | Status: AC

## 2017-06-12 MED ORDER — DOCUSATE SODIUM 100 MG PO CAPS: 100.0000 mg | ORAL_CAPSULE | Freq: Two times a day (BID) | ORAL | 0 refills | Status: AC

## 2017-06-12 MED ORDER — METHOCARBAMOL 500 MG PO TABS: 500.0000 mg | ORAL_TABLET | Freq: Four times a day (QID) | ORAL | 0 refills | Status: AC | PRN

## 2017-06-12 MED ORDER — OXYCODONE-ACETAMINOPHEN 7.5-325 MG PO TABS: 1.0000 | ORAL_TABLET | ORAL | 0 refills | Status: AC | PRN

## 2017-06-12 NOTE — Progress Notes (Signed)
PT BID Note   06/12/17 1352  PT Visit Information  Assistance Needed +1  Precautions  Precautions None  Restrictions  RLE Weight Bearing WBAT  Pain Assessment  Pain Score 3  Pain Location R hip   Pain Descriptors / Indicators Guarding;Sore  Cognition  Arousal/Alertness Awake/alert  Behavior During Therapy WFL for tasks assessed/performed  Overall Cognitive Status Within Functional Limits for tasks assessed  Bed Mobility  Overal bed mobility Needs Assistance  Bed Mobility Supine to Sit  Supine to sit Min assist (spouse assisting for R LE)  Transfers  Overall transfer level Needs assistance  Equipment used Rolling walker (2 wheeled)  Transfers Sit to/from Stand  Sit to Stand Supervision  Ambulation/Gait  Ambulation/Gait assistance Supervision  Ambulation Distance (Feet) 12 Feet  Assistive device Rolling walker (2 wheeled)  Gait Pattern/deviations Step-through pattern;Trunk flexed;Decreased stride length  General Gait Details in room to get to step for practice home entry  Stairs Yes  Stairs assistance Min guard  Stair Management Backwards;With walker  Number of Stairs 1  General stair comments demonstrated then pt performed with reverse technique; also discussed car transfer to tall trunk with step stool  Balance  Overall balance assessment Needs assistance  Sitting balance-Leahy Scale Good  Standing balance-Leahy Scale Fair  General Comments  General comments (skin integrity, edema, etc.) Spouse/pt educated on progressing with exercises and ambulation at home  PT - End of Session  Activity Tolerance Patient tolerated treatment well  Patient left in chair;with call bell/phone within reach;with family/visitor present  PT - Assessment/Plan  PT Plan Current plan remains appropriate  PT Visit Diagnosis Other abnormalities of gait and mobility (R26.89);Pain  Pain - Right/Left Right  Pain - part of body Hip  PT Frequency (ACUTE ONLY) 7X/week  Follow Up Recommendations DC  plan and follow up therapy as arranged by surgeon  PT equipment Rolling walker with 5" wheels  AM-PAC PT "6 Clicks" Daily Activity Outcome Measure  Difficulty turning over in bed (including adjusting bedclothes, sheets and blankets)? 3  Difficulty moving from lying on back to sitting on the side of the bed?  1  Difficulty sitting down on and standing up from a chair with arms (e.g., wheelchair, bedside commode, etc,.)? 3  Help needed moving to and from a bed to chair (including a wheelchair)? 3  Help needed walking in hospital room? 3  Help needed climbing 3-5 steps with a railing?  3  6 Click Score 16  Mobility G Code  CK  PT Goal Progression  Progress towards PT goals Progressing toward goals  PT Time Calculation  PT Start Time (ACUTE ONLY) 1153  PT Stop Time (ACUTE ONLY) 1208  PT Time Calculation (min) (ACUTE ONLY) 15 min  PT General Charges  $$ ACUTE PT VISIT 1 Visit  PT Treatments  $Gait Training 8-22 mins  Hartmanyndi Wynn, [place] CarolinaPT [phone] 06/12/2017

## 2017-06-12 NOTE — Progress Notes (Signed)
Removed IV, provided discharge education/instructions, all questions and concerns addressed, patient not in distress, discharged home accompanied by husband. 

## 2017-06-12 NOTE — Discharge Summary (Signed)
Patient ID: Donna [name] MRN: [id number] DOB/AGE: 02/17/1956 61 y.o.  Admit date: 06/10/2017 Discharge date: 06/12/2017  Admission Diagnoses:  Principal Problem:   Primary localized osteoarthritis of right hip Active Problems:   Primary osteoarthritis of right hip   Discharge Diagnoses:  Same  Past Medical History:  Diagnosis Date  . Anxiety    takes Xanax prn  . Arthritis    back   . Bronchitis    hx of-many yrs ago  . Chronic back pain   . COPD (chronic obstructive pulmonary disease) (HCC)   . Diabetes mellitus    takes Metformin and GLipizide daily  . Diverticulosis   . GERD (gastroesophageal reflux disease)    takes Nexium daily  . Hyperlipidemia    takes Pravastatin daily  . Hypertension    takes Benazepril daily  . Hypothyroidism    takes SYnthroid daily  . Seizures (HCC)    has not had one since 2015; taken off seizure meds    Surgeries: Procedure(s): RIGHT TOTAL HIP ARTHROPLASTY ANTERIOR APPROACH on 06/10/2017   Consultants:   Discharged Condition: Improved  Hospital Course: Donna [name] is an 61 y.o. female who was admitted 06/10/2017 for operative treatment ofPrimary localized osteoarthritis of right hip. Patient has severe unremitting pain that affects sleep, daily activities, and work/hobbies. After pre-op clearance the patient was taken to the operating room on 06/10/2017 and underwent  Procedure(s): RIGHT TOTAL HIP ARTHROPLASTY ANTERIOR APPROACH.    Patient was given perioperative antibiotics: Anti-infectives    Start     Dose/Rate Route Frequency Ordered Stop   06/10/17 1400  ceFAZolin (ANCEF) IVPB 2g/100 mL premix     2 g 200 mL/hr over 30 Minutes Intravenous Every 6 hours 06/10/17 1318 06/10/17 2214   06/10/17 0700  ceFAZolin (ANCEF) 3 g in dextrose 5 % 50 mL IVPB     3 g 130 mL/hr over 30 Minutes Intravenous To ShortStay [facility] 06/09/17 1331 06/10/17 0750       Patient was given sequential compression devices, early ambulation, and  chemoprophylaxis to prevent DVT.  Patient benefited maximally from hospital stay and there were no complications.    Recent vital signs: Patient Vitals for the past 24 hrs:  BP Temp Temp src Pulse Resp SpO2  06/11/17 1945 122/60 98 F (36.7 C) Oral 85 18 98 %  06/11/17 1838 (!) 120/53 - - - - -  06/11/17 1730 (!) 97/57 - - - - -  06/11/17 1609 (!) 97/56 - - 97 - 97 %  06/11/17 1552 (!) 82/43 98.2 F (36.8 C) Oral 94 18 94 %  06/11/17 0828 - - - - - 95 %     Recent laboratory studies: No results for input(s): WBC, HGB, HCT, PLT, NA, K, CL, CO2, BUN, CREATININE, GLUCOSE, INR, CALCIUM in the last 72 hours.  Invalid input(s): PT, 2   Discharge Medications:   Allergies as of 06/12/2017      Reactions   Tramadol Other (See Comments)   SEIZURES   Other Rash   Steri strip breaks out   Tape Rash   Steri strips caused infection      Medication List    TAKE these medications   albuterol 108 (90 Base) MCG/ACT inhaler Commonly known as:  PROVENTIL HFA Inhale 2 puffs into the lungs every 6 (six) hours as needed for wheezing or shortness of breath.   amLODipine 5 MG tablet Commonly known as:  NORVASC Take 1 tablet (5 mg total) by mouth daily.  What changed:  how much to take   aspirin 325 MG EC tablet Take 1 tablet (325 mg total) by mouth 2 (two) times daily after a meal.   bisacodyl 5 MG EC tablet Commonly known as:  DULCOLAX Take 1 tablet (5 mg total) by mouth daily as needed for moderate constipation.   docusate sodium 100 MG capsule Commonly known as:  COLACE Take 1 capsule (100 mg total) by mouth 2 (two) times daily.   DULoxetine 60 MG capsule Commonly known as:  CYMBALTA Take 60 mg by mouth every evening.   ferrous sulfate 325 (65 FE) MG tablet Take 325 mg by mouth every other day.   Fluticasone-Salmeterol 250-50 MCG/DOSE Aepb Commonly known as:  ADVAIR DISKUS Inhale 1 puff into the lungs 2 (two) times daily. Rinse mouth after each use   gabapentin 300 MG  capsule Commonly known as:  NEURONTIN Take 300-600 mg by mouth 3 (three) times daily.   glipiZIDE 10 MG 24 hr tablet Commonly known as:  GLUCOTROL XL Take 10 mg by mouth 2 (two) times daily.   levETIRAcetam 1000 MG tablet Commonly known as:  KEPPRA Take 1 tablet (1,000 mg total) by mouth 2 (two) times daily.   levothyroxine 112 MCG tablet Commonly known as:  SYNTHROID, LEVOTHROID Take 112 mcg by mouth daily before breakfast.   lisinopril 5 MG tablet Commonly known as:  PRINIVIL,ZESTRIL Take 1 tablet (5 mg total) by mouth daily. What changed:  when to take this   Magnesium Oxide 500 MG Tabs Take 500 mg by mouth at bedtime.   metFORMIN 1000 MG tablet Commonly known as:  GLUCOPHAGE Take 1,000 mg by mouth 2 (two) times daily.   methocarbamol 500 MG tablet Commonly known as:  ROBAXIN Take 1 tablet (500 mg total) by mouth every 6 (six) hours as needed for muscle spasms.   oxyCODONE-acetaminophen 7.5-325 MG tablet Commonly known as:  PERCOCET Take 1-2 tablets by mouth every 4 (four) hours as needed for severe pain. What changed:  how much to take  when to take this  Another medication with the same name was removed. Continue taking this medication, and follow the directions you see here.   pravastatin 20 MG tablet Commonly known as:  PRAVACHOL Take 20 mg by mouth every evening.   tiotropium 18 MCG inhalation capsule Commonly known as:  SPIRIVA Place 18 mcg into inhaler and inhale daily after lunch.   tiZANidine 4 MG tablet Commonly known as:  ZANAFLEX Take 4 mg by mouth every 8 (eight) hours as needed for muscle spasms.   traZODone 50 MG tablet Commonly known as:  DESYREL Take 100 mg by mouth every evening.   VOLTAREN 1 % Gel Generic drug:  diclofenac sodium Apply 2 g topically 4 (four) times daily as needed (joint pain).            Durable Medical Equipment        Start     Ordered   06/10/17 1319  DME Walker rolling  Once    Question:  Patient  needs a walker to treat with the following condition  Answer:  Primary osteoarthritis of right hip   06/10/17 1318   06/10/17 1319  DME 3 n 1  Once     06/10/17 1318   06/10/17 1319  DME Bedside commode  Once    Question:  Patient needs a bedside commode to treat with the following condition  Answer:  Primary osteoarthritis of right hip   06/10/17 1318  Diagnostic Studies: Dg Chest 2 View  Result Date: 06/02/2017 CLINICAL DATA:  Preop for hip arthroplasty. EXAM: CHEST  2 VIEW COMPARISON:  Chest x-ray dated June 19, 2014, and December 23, 2012. FINDINGS: The cardiomediastinal silhouette is normal in size. Normal pulmonary vascularity. No focal consolidation, pleural effusion, or pneumothorax. Slightly increased anterior wedging of the T12 vertebral body with associated thoracic kyphosis when compared to prior chest x-ray from 2014. Partially visualized lumbar spinal fusion hardware. Cholecystectomy. IMPRESSION: 1.  No active cardiopulmonary disease. 2. Slightly increased anterior wedging of the T12 vertebral body with associated thoracic kyphosis when compared to prior chest x-ray from 2014. Electronically Signed   By: Obie Dredge M.D.   On: 06/02/2017 17:00   Dg C-arm 1-60 Min  Result Date: 06/10/2017 EXAM: OPERATIVE RIGHT HIP (WITH PELVIS IF PERFORMED) 2 VIEWS TECHNIQUE: Fluoroscopic spot image(s) were submitted for interpretation post-operatively. COMPARISON:  No recent prior. FINDINGS: Total right hip arthroplasty. Anatomic alignment. No acute bony abnormality. IMPRESSION: Total right hip arthroplasty.  Anatomic alignment. Electronically Signed   By: Maisie Fus  Register   On: 06/10/2017 09:25   Dg Hip Operative Unilat With Pelvis Right  Result Date: 06/10/2017 EXAM: OPERATIVE RIGHT HIP (WITH PELVIS IF PERFORMED) 2 VIEWS TECHNIQUE: Fluoroscopic spot image(s) were submitted for interpretation post-operatively. COMPARISON:  No recent prior. FINDINGS: Total right hip arthroplasty. Anatomic  alignment. No acute bony abnormality. IMPRESSION: Total right hip arthroplasty.  Anatomic alignment. Electronically Signed   By: Maisie Fus  Register   On: 06/10/2017 09:25    Disposition: 01-Home or Self Care  Discharge Instructions    Call MD / Call 911    Complete by:  As directed    If you experience chest pain or shortness of breath, CALL 911 and be transported to the hospital emergency room.  If you develope a fever above 101 F, pus (white drainage) or increased drainage or redness at the wound, or calf pain, call your surgeon's office.   Constipation Prevention    Complete by:  As directed    Drink plenty of fluids.  Prune juice may be helpful.  You may use a stool softener, such as Colace (over the counter) 100 mg twice a day.  Use MiraLax (over the counter) for constipation as needed.   Diet - low sodium heart healthy    Complete by:  As directed    Discharge instructions    Complete by:  As directed    INSTRUCTIONS AFTER JOINT REPLACEMENT   Remove items at home which could result in a fall. This includes throw rugs or furniture in walking pathways ICE to the affected joint every three hours while awake for 30 minutes at a time, for at least the first 3-5 days, and then as needed for pain and swelling.  Continue to use ice for pain and swelling. You may notice swelling that will progress down to the foot and ankle.  This is normal after surgery.  Elevate your leg when you are not up walking on it.   Continue to use the breathing machine you got in the hospital (incentive spirometer) which will help keep your temperature down.  It is common for your temperature to cycle up and down following surgery, especially at night when you are not up moving around and exerting yourself.  The breathing machine keeps your lungs expanded and your temperature down.   DIET:  As you were doing prior to hospitalization, we recommend a well-balanced diet.  DRESSING / WOUND CARE / SHOWERING  You may  shower 3 days after surgery, but keep the wounds dry during showering.  You may use an occlusive plastic wrap (Press'n Seal for example), NO SOAKING/SUBMERGING IN THE BATHTUB.  If the bandage gets wet, change with a clean dry gauze.  If the incision gets wet, pat the wound dry with a clean towel.  ACTIVITY  Increase activity slowly as tolerated, but follow the weight bearing instructions below.   No driving for 6 weeks or until further direction given by your physician.  You cannot drive while taking narcotics.  No lifting or carrying greater than 10 lbs. until further directed by your surgeon. Avoid periods of inactivity such as sitting longer than an hour when not asleep. This helps prevent blood clots.  You may return to work once you are authorized by your doctor.     WEIGHT BEARING   Weight bearing as tolerated with assist device (walker, cane, etc) as directed, use it as long as suggested by your surgeon or therapist, typically at least 4-6 weeks.   EXERCISES  Results after joint replacement surgery are often greatly improved when you follow the exercise, range of motion and muscle strengthening exercises prescribed by your doctor. Safety measures are also important to protect the joint from further injury. Any time any of these exercises cause you to have increased pain or swelling, decrease what you are doing until you are comfortable again and then slowly increase them. If you have problems or questions, call your caregiver or physical therapist for advice.   Rehabilitation is important following a joint replacement. After just a few days of immobilization, the muscles of the leg can become weakened and shrink (atrophy).  These exercises are designed to build up the tone and strength of the thigh and leg muscles and to improve motion. Often times heat used for twenty to thirty minutes before working out will loosen up your tissues and help with improving the range of motion but do not  use heat for the first two weeks following surgery (sometimes heat can increase post-operative swelling).   These exercises can be done on a training (exercise) mat, on the floor, on a table or on a bed. Use whatever works the best and is most comfortable for you.    Use music or television while you are exercising so that the exercises are a pleasant break in your day. This will make your life better with the exercises acting as a break in your routine that you can look forward to.   Perform all exercises about fifteen times, three times per day or as directed.  You should exercise both the operative leg and the other leg as well.   Exercises include:   Quad Sets - Tighten up the muscle on the front of the thigh (Quad) and hold for 5-10 seconds.   Straight Leg Raises - With your knee straight (if you were given a brace, keep it on), lift the leg to 60 degrees, hold for 3 seconds, and slowly lower the leg.  Perform this exercise against resistance later as your leg gets stronger.  Leg Slides: Lying on your back, slowly slide your foot toward your buttocks, bending your knee up off the floor (only go as far as is comfortable). Then slowly slide your foot back down until your leg is flat on the floor again.  Angel Wings: Lying on your back spread your legs to the side as far apart as you can without causing discomfort.  Hamstring  Strength:  Lying on your back, push your heel against the floor with your leg straight by tightening up the muscles of your buttocks.  Repeat, but this time bend your knee to a comfortable angle, and push your heel against the floor.  You may put a pillow under the heel to make it more comfortable if necessary.   A rehabilitation program following joint replacement surgery can speed recovery and prevent re-injury in the future due to weakened muscles. Contact your doctor or a physical therapist for more information on knee rehabilitation.    CONSTIPATION  Constipation is  defined medically as fewer than three stools per week and severe constipation as less than one stool per week.  Even if you have a regular bowel pattern at home, your normal regimen is likely to be disrupted due to multiple reasons following surgery.  Combination of anesthesia, postoperative narcotics, change in appetite and fluid intake all can affect your bowels.   YOU MUST use at least one of the following options; they are listed in order of increasing strength to get the job done.  They are all available over the counter, and you may need to use some, POSSIBLY even all of these options:    Drink plenty of fluids (prune juice may be helpful) and high fiber foods Colace 100 mg by mouth twice a day  Senokot for constipation as directed and as needed Dulcolax (bisacodyl), take with full glass of water  Miralax (polyethylene glycol) once or twice a day as needed.  If you have tried all these things and are unable to have a bowel movement in the first 3-4 days after surgery call either your surgeon or your primary doctor.    If you experience loose stools or diarrhea, hold the medications until you stool forms back up.  If your symptoms do not get better within 1 week or if they get worse, check with your doctor.  If you experience "the worst abdominal pain ever" or develop nausea or vomiting, please contact the office immediately for further recommendations for treatment.   ITCHING:  If you experience itching with your medications, try taking only a single pain pill, or even half a pain pill at a time.  You can also use Benadryl over the counter for itching or also to help with sleep.   TED HOSE STOCKINGS:  Use stockings on both legs until for at least 2 weeks or as directed by physician office. They may be removed at night for sleeping.  MEDICATIONS:  See your medication summary on the "After Visit Summary" that nursing will review with you.  You may have some home medications which will be placed  on hold until you complete the course of blood thinner medication.  It is important for you to complete the blood thinner medication as prescribed.  PRECAUTIONS:  If you experience chest pain or shortness of breath - call 911 immediately for transfer to the hospital emergency department.   If you develop a fever greater that 101 F, purulent drainage from wound, increased redness or drainage from wound, foul odor from the wound/dressing, or calf pain - CONTACT YOUR SURGEON.                                                   FOLLOW-UP APPOINTMENTS:  If you do not already have  a post-op appointment, please call the office for an appointment to be seen by your surgeon.  Guidelines for how soon to be seen are listed in your "After Visit Summary", but are typically between 1-4 weeks after surgery.  OTHER INSTRUCTIONS:   Knee Replacement:  Do not place pillow under knee, focus on keeping the knee straight while resting. CPM instructions: 0-90 degrees, 2 hours in the morning, 2 hours in the afternoon, and 2 hours in the evening. Place foam block, curve side up under heel at all times except when in CPM or when walking.  DO NOT modify, tear, cut, or change the foam block in any way.  MAKE SURE YOU:  Understand these instructions.  Get help right away if you are not doing well or get worse.    Thank you for letting us be a part of your medical care team.  It is a privilege we respect greatly.  We hope these instructions will help you stay on track for a fast and full recovery!   Increase activity slowly as tolerated    Complete by:  As directed       Follow-up Information    Marcene Corning, MD. Schedule an appointment as soon as possible for a visit in 2 week(s).   Specialty:  Orthopedic Surgery Contact information: [address] [address] [phone]        Care, [facility] Follow up.   Specialty:  Home Health Services Why:  A representative from [facility] will  contact you to arrange start date and time for your therapy. Contact information: [address] [place] [address] [phone]            Signed: Drema Halon 06/12/2017, 7:49 AM

## 2017-06-12 NOTE — Progress Notes (Signed)
Physical Therapy Treatment Patient Details Name: Donna [name] MRN: [id number] DOB: 07/31/1956 Today's Date: 06/12/2017    History of Present Illness Pt is a 61 y/o female s/p elective R THA, direct anterior approach. PMH includes seizures, HTN, arthritis, COPD, and DM.     PT Comments    Patient progressing with ambulation and seems less painful this session.  Will see prior to d/c with spouse for stair training for home entry.   Follow Up Recommendations  DC plan and follow up therapy as arranged by surgeon;Supervision for mobility/OOB     Equipment Recommendations  Rolling walker with 5" wheels    Recommendations for Other Services       Precautions / Restrictions Precautions Precautions: None Precaution Comments: Reviewed THA exercise handout with pt.  Restrictions RLE Weight Bearing: Weight bearing as tolerated    Mobility  Bed Mobility Overal bed mobility: Needs Assistance       Supine to sit: Min assist;HOB elevated Sit to supine: Min assist   General bed mobility comments: assist for R LE  Transfers Overall transfer level: Needs assistance Equipment used: Rolling walker (2 wheeled) Transfers: Sit to/from Stand Sit to Stand: Min guard         General transfer comment: for balance  Ambulation/Gait   Ambulation Distance (Feet): 140 Feet Assistive device: Rolling walker (2 wheeled) Gait Pattern/deviations: Step-through pattern;Trunk flexed;Decreased stride length     General Gait Details: able to perform step through pattern, but remains with slightly flexed trunk and increased proximity to walker   Stairs            Wheelchair Mobility    Modified Rankin (Stroke Patients Only)       Balance Overall balance assessment: Needs assistance   Sitting balance-Leahy Scale: Good       Standing balance-Leahy Scale: Fair                              Cognition Arousal/Alertness: Awake/alert Behavior During Therapy: WFL for  tasks assessed/performed Overall Cognitive Status: Within Functional Limits for tasks assessed                                        Exercises Total Joint Exercises Ankle Circles/Pumps: AROM;Both;10 reps;Supine Quad Sets: AROM;Right;10 reps;Supine Short Arc Quad: AROM;Right;10 reps;Supine Heel Slides: AAROM;Right;10 reps;Supine Hip ABduction/ADduction: Right;10 reps;Supine;AROM    General Comments        Pertinent Vitals/Pain Faces Pain Scale: Hurts little more Pain Location: R hip  Pain Descriptors / Indicators: Guarding;Sore Pain Intervention(s): Monitored during session;Repositioned    Home Living                      Prior Function            PT Goals (current goals can now be found in the care plan section) Progress towards PT goals: Progressing toward goals    Frequency    7X/week      PT Plan Current plan remains appropriate    Co-evaluation              AM-PAC PT "6 Clicks" Daily Activity  Outcome Measure  Difficulty turning over in bed (including adjusting bedclothes, sheets and blankets)?: A Little Difficulty moving from lying on back to sitting on the side of the bed? : Total Difficulty sitting down  on and standing up from a chair with arms (e.g., wheelchair, bedside commode, etc,.)?: A Little   Help needed walking in hospital room?: A Little Help needed climbing 3-5 steps with a railing? : A Little 6 Click Score: 13    End of Session Equipment Utilized During Treatment: Gait belt Activity Tolerance: Patient tolerated treatment well Patient left: in bed;with call bell/phone within reach   PT Visit Diagnosis: Other abnormalities of gait and mobility (R26.89);Pain Pain - Right/Left: Right Pain - part of body: Hip     Time: 1610-9604 PT Time Calculation (min) (ACUTE ONLY): 31 min  Charges:  $Gait Training: 8-22 mins $Therapeutic Exercise: 8-22 mins                    G CodesSheran Lawless,  [place] [phone] 06/12/2017    Elray Mcgregor 06/12/2017, 1:48 PM

## 2017-12-19 ENCOUNTER — Other Ambulatory Visit: Payer: Self-pay | Admitting: Nurse Practitioner

## 2017-12-19 DIAGNOSIS — Z1231 Encounter for screening mammogram for malignant neoplasm of breast: Secondary | ICD-10-CM

## 2018-01-07 ENCOUNTER — Ambulatory Visit
Admission: RE | Admit: 2018-01-07 | Discharge: 2018-01-07 | Disposition: A | Discharge status: Home / Self Care | Payer: BLUE CROSS/BLUE SHIELD | Source: Ambulatory Visit | Attending: Nurse Practitioner | Admitting: Nurse Practitioner

## 2018-01-07 DIAGNOSIS — Z1231 Encounter for screening mammogram for malignant neoplasm of breast: Secondary | ICD-10-CM

## 2019-04-13 ENCOUNTER — Other Ambulatory Visit: Payer: Self-pay | Admitting: Neurosurgery

## 2019-04-13 DIAGNOSIS — M4326 Fusion of spine, lumbar region: Secondary | ICD-10-CM

## 2019-05-08 ENCOUNTER — Ambulatory Visit
Admission: RE | Admit: 2019-05-08 | Discharge: 2019-05-08 | Disposition: A | Discharge status: Home / Self Care | Payer: Medicare HMO | Source: Ambulatory Visit | Attending: Neurosurgery | Admitting: Neurosurgery

## 2019-05-08 ENCOUNTER — Other Ambulatory Visit: Payer: Self-pay

## 2019-05-08 DIAGNOSIS — M4326 Fusion of spine, lumbar region: Secondary | ICD-10-CM

## 2020-01-28 ENCOUNTER — Ambulatory Visit: Payer: Medicare HMO | Attending: Internal Medicine

## 2020-01-28 DIAGNOSIS — Z23 Encounter for immunization: Secondary | ICD-10-CM

## 2020-01-28 NOTE — Progress Notes (Signed)
   Covid-19 Vaccination Clinic  Name:  Donna [name]    MRN: [id number] DOB: 1956-04-12  01/28/2020  Donna [name] was observed post Covid-19 immunization for 15 minutes without incident. She was provided with Vaccine Information Sheet and instruction to access the V-Safe system.   Donna [name] was instructed to call 911 with any severe reactions post vaccine: Marland Kitchen Difficulty breathing  . Swelling of face and throat  . A fast heartbeat  . A bad rash all over body  . Dizziness and weakness   Immunizations Administered    Name Date Dose VIS Date Route   Pfizer COVID-19 Vaccine 01/28/2020  9:42 AM 0.3 mL 10/08/2019 Intramuscular   Manufacturer: [employer], [employer]   Lot: [id number]   NDC: [address]-6

## 2020-02-22 ENCOUNTER — Ambulatory Visit: Payer: Medicare HMO | Attending: Internal Medicine

## 2020-02-22 DIAGNOSIS — Z23 Encounter for immunization: Secondary | ICD-10-CM

## 2020-02-22 NOTE — Progress Notes (Signed)
   Covid-19 Vaccination Clinic  Name:  Donna [name]    MRN: [id number] DOB: [birth year]  02/22/2020  Donna [name] was observed post Covid-19 immunization for 15 minutes without incident. She was provided with Vaccine Information Sheet and instruction to access the V-Safe system.   Donna [name] was instructed to call 911 with any severe reactions post vaccine: Marland Kitchen Difficulty breathing  . Swelling of face and throat  . A fast heartbeat  . A bad rash all over body  . Dizziness and weakness   Immunizations Administered    Name Date Dose VIS Date Route   Pfizer COVID-19 Vaccine 02/22/2020 10:07 AM 0.3 mL 12/22/2018 Intramuscular   Manufacturer: [employer], [employer]   Lot: [id number]   NDC: [address]-1

## 2020-10-23 ENCOUNTER — Other Ambulatory Visit: Payer: Self-pay | Admitting: Nurse Practitioner

## 2020-10-23 DIAGNOSIS — Z1231 Encounter for screening mammogram for malignant neoplasm of breast: Secondary | ICD-10-CM

## 2020-11-11 ENCOUNTER — Other Ambulatory Visit: Payer: Self-pay | Admitting: Nurse Practitioner

## 2020-11-11 DIAGNOSIS — M8580 Other specified disorders of bone density and structure, unspecified site: Secondary | ICD-10-CM

## 2020-11-30 ENCOUNTER — Ambulatory Visit: Payer: Medicare HMO

## 2021-03-09 ENCOUNTER — Ambulatory Visit
Admission: RE | Admit: 2021-03-09 | Discharge: 2021-03-09 | Disposition: A | Discharge status: Home / Self Care | Payer: Medicare HMO | Source: Ambulatory Visit | Attending: Nurse Practitioner | Admitting: Nurse Practitioner

## 2021-03-09 ENCOUNTER — Other Ambulatory Visit: Payer: Self-pay

## 2021-03-09 DIAGNOSIS — Z1231 Encounter for screening mammogram for malignant neoplasm of breast: Secondary | ICD-10-CM

## 2021-03-09 DIAGNOSIS — M8580 Other specified disorders of bone density and structure, unspecified site: Secondary | ICD-10-CM

## 2022-04-02 ENCOUNTER — Other Ambulatory Visit: Payer: Self-pay | Admitting: Nurse Practitioner

## 2022-04-02 DIAGNOSIS — Z1231 Encounter for screening mammogram for malignant neoplasm of breast: Secondary | ICD-10-CM

## 2022-04-09 ENCOUNTER — Other Ambulatory Visit: Payer: Self-pay | Admitting: Neurosurgery

## 2022-04-09 DIAGNOSIS — M47816 Spondylosis without myelopathy or radiculopathy, lumbar region: Secondary | ICD-10-CM

## 2022-04-16 ENCOUNTER — Inpatient Hospital Stay: Admission: RE | Admit: 2022-04-16 | Payer: Medicare HMO | Source: Ambulatory Visit

## 2022-04-17 ENCOUNTER — Ambulatory Visit
Admission: RE | Admit: 2022-04-17 | Discharge: 2022-04-17 | Disposition: A | Discharge status: Home / Self Care | Payer: Medicare HMO | Source: Ambulatory Visit | Attending: Nurse Practitioner | Admitting: Nurse Practitioner

## 2022-04-17 DIAGNOSIS — Z1231 Encounter for screening mammogram for malignant neoplasm of breast: Secondary | ICD-10-CM

## 2022-05-09 ENCOUNTER — Ambulatory Visit
Admission: RE | Admit: 2022-05-09 | Discharge: 2022-05-09 | Disposition: A | Discharge status: Home / Self Care | Payer: Medicare HMO | Source: Ambulatory Visit | Attending: Neurosurgery | Admitting: Neurosurgery

## 2022-05-09 DIAGNOSIS — M47816 Spondylosis without myelopathy or radiculopathy, lumbar region: Secondary | ICD-10-CM

## 2023-03-21 ENCOUNTER — Other Ambulatory Visit: Payer: Self-pay | Admitting: Nurse Practitioner

## 2023-03-21 DIAGNOSIS — Z1231 Encounter for screening mammogram for malignant neoplasm of breast: Secondary | ICD-10-CM
